# Patient Record
Sex: Female | Born: 1985 | Race: Black or African American | Hispanic: No | Marital: Married | Smoking: Never smoker
Health system: Southern US, Community
[De-identification: ages and names within clinical notes are randomized; demographics above are authoritative.]

## PROBLEM LIST (undated history)

## (undated) DIAGNOSIS — A549 Gonococcal infection, unspecified: Secondary | ICD-10-CM

## (undated) DIAGNOSIS — A749 Chlamydial infection, unspecified: Secondary | ICD-10-CM

## (undated) DIAGNOSIS — B977 Papillomavirus as the cause of diseases classified elsewhere: Secondary | ICD-10-CM

## (undated) DIAGNOSIS — T7840XA Allergy, unspecified, initial encounter: Secondary | ICD-10-CM

## (undated) DIAGNOSIS — G43909 Migraine, unspecified, not intractable, without status migrainosus: Secondary | ICD-10-CM

## (undated) DIAGNOSIS — K219 Gastro-esophageal reflux disease without esophagitis: Secondary | ICD-10-CM

## (undated) DIAGNOSIS — IMO0002 Reserved for concepts with insufficient information to code with codable children: Secondary | ICD-10-CM

## (undated) HISTORY — DX: Gastro-esophageal reflux disease without esophagitis: K21.9

## (undated) HISTORY — DX: Gonococcal infection, unspecified: A54.9

## (undated) HISTORY — DX: Migraine, unspecified, not intractable, without status migrainosus: G43.909

## (undated) HISTORY — DX: Chlamydial infection, unspecified: A74.9

## (undated) HISTORY — PX: COLPOSCOPY: SHX161

## (undated) HISTORY — DX: Papillomavirus as the cause of diseases classified elsewhere: B97.7

## (undated) HISTORY — DX: Reserved for concepts with insufficient information to code with codable children: IMO0002

## (undated) HISTORY — DX: Allergy, unspecified, initial encounter: T78.40XA

---
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
## 2009-06-08 ENCOUNTER — Emergency Department (HOSPITAL_COMMUNITY): Admission: EM | Admit: 2009-06-08 | Discharge: 2009-06-08 | Payer: Self-pay | Admitting: Emergency Medicine

## 2009-08-29 DIAGNOSIS — IMO0002 Reserved for concepts with insufficient information to code with codable children: Secondary | ICD-10-CM

## 2009-08-29 DIAGNOSIS — R87619 Unspecified abnormal cytological findings in specimens from cervix uteri: Secondary | ICD-10-CM

## 2009-08-29 HISTORY — DX: Reserved for concepts with insufficient information to code with codable children: IMO0002

## 2009-08-29 HISTORY — DX: Unspecified abnormal cytological findings in specimens from cervix uteri: R87.619

## 2010-05-20 LAB — URINALYSIS, ROUTINE W REFLEX MICROSCOPIC
Bilirubin Urine: NEGATIVE
Hgb urine dipstick: NEGATIVE
Protein, ur: 30 mg/dL — AB
Specific Gravity, Urine: 1.028 (ref 1.005–1.030)
Urobilinogen, UA: 1 mg/dL (ref 0.0–1.0)

## 2010-05-20 LAB — URINE MICROSCOPIC-ADD ON

## 2011-11-13 ENCOUNTER — Emergency Department (HOSPITAL_COMMUNITY)
Admission: EM | Admit: 2011-11-13 | Discharge: 2011-11-14 | Disposition: A | Discharge status: Home / Self Care | Payer: PRIVATE HEALTH INSURANCE | Attending: Emergency Medicine | Admitting: Emergency Medicine

## 2011-11-13 ENCOUNTER — Encounter (HOSPITAL_COMMUNITY): Payer: Self-pay | Admitting: Emergency Medicine

## 2011-11-13 DIAGNOSIS — M549 Dorsalgia, unspecified: Secondary | ICD-10-CM | POA: Insufficient documentation

## 2011-11-13 NOTE — ED Notes (Signed)
Pt c/o L flank pain onset 4 days ago. Denies urinary s/s, denies injury. Non radiating.

## 2011-11-14 ENCOUNTER — Emergency Department (HOSPITAL_COMMUNITY): Payer: PRIVATE HEALTH INSURANCE

## 2011-11-14 LAB — URINALYSIS, ROUTINE W REFLEX MICROSCOPIC
Glucose, UA: NEGATIVE mg/dL
Protein, ur: NEGATIVE mg/dL

## 2011-11-14 LAB — URINE MICROSCOPIC-ADD ON

## 2011-11-14 MED ORDER — DIAZEPAM 5 MG PO TABS: 5.0000 mg | ORAL_TABLET | Freq: Two times a day (BID) | ORAL | Status: AC

## 2011-11-14 NOTE — ED Provider Notes (Signed)
Medical screening examination/treatment/procedure(s) were performed by non-physician practitioner and as supervising physician I was immediately available for consultation/collaboration.   Lyanne Co, MD 11/14/11 [phone]

## 2011-11-14 NOTE — ED Provider Notes (Signed)
History     CSN: [id number]  Arrival date & time 11/13/11  2328   First MD Initiated Contact with Patient 11/14/11 [phone]      Chief Complaint  Patient presents with  . Flank Pain    (Consider location/radiation/quality/duration/timing/severity/associated sxs/prior treatment) HPI History provided by pt.   Pt presents w/ constant left mid-back pain w/ associated SOB x 3 days.  Pain is pleuritic and aggravated by movement but has improved since she took ibuprofen.  Describes SOB as the sensation that she can't take a deep breath.  Denies fever, cough, chest pain.  Denies abd pain and urinary sx.  Denies trauma.  No LE pain/edema nor RF for PE.    History reviewed. No pertinent past medical history.  History reviewed. No pertinent past surgical history.  No family history on file.  History  Substance Use Topics  . Smoking status: Never Smoker   . Smokeless tobacco: Not on file  . Alcohol Use: Yes     occasional    OB History    Grav Para Term Preterm Abortions TAB SAB Ect Mult Living                  Review of Systems  All other systems reviewed and are negative.    Allergies  Penicillins  Home Medications  No current outpatient prescriptions on file.  BP 112/76  Pulse 84  Temp 98.9 F (37.2 C) (Oral)  Resp 16  Wt 125 lb (56.7 kg)  SpO2 98%  Physical Exam  Nursing note and vitals reviewed. Constitutional: She is oriented to person, place, and time. She appears well-developed and well-nourished. No distress.  HENT:  Head: Normocephalic and atraumatic.  Eyes:       Normal appearance  Neck: Normal range of motion.  Cardiovascular: Normal rate and regular rhythm.   Pulmonary/Chest: Effort normal and breath sounds normal. No respiratory distress.       No pleuritic pain on exam  Abdominal: Soft. Bowel sounds are normal. She exhibits no distension. There is no tenderness.  Musculoskeletal: Normal range of motion.       No LE tenderness/edema.  Entire spine  non-tender.  Mild tenderness left mid-back.  Pain aggravated by twisting torso to the left but not w/ ROM upper extremities.   Neurological: She is alert and oriented to person, place, and time.  Skin: Skin is warm and dry. No rash noted.  Psychiatric: She has a normal mood and affect. Her behavior is normal.    ED Course  Procedures (including critical care time)  Labs Reviewed  URINALYSIS, ROUTINE W REFLEX MICROSCOPIC - Abnormal; Notable for the following:    APPearance CLOUDY (*)     Leukocytes, UA TRACE (*)     All other components within normal limits  URINE MICROSCOPIC-ADD ON - Abnormal; Notable for the following:    Squamous Epithelial / LPF FEW (*)     Bacteria, UA FEW (*)     All other components within normal limits  D-DIMER, QUANTITATIVE  PREGNANCY, URINE   Dg Chest 2 View  11/14/2011  *RADIOLOGY REPORT*  Clinical Data: Chest pain  CHEST - 2 VIEW  Comparison: None.  Findings: Lungs are clear. No pleural effusion or pneumothorax. The cardiomediastinal contours are within normal limits. The visualized bones and soft tissues are without significant appreciable abnormality.  IMPRESSION: No radiographic evidence of acute cardiopulmonary process.   Original Report Authenticated By: Waneta Martins, M.D.      1. Back  pain       MDM  Healthy 25yo F presents w/ non-traumatic, pleuritic left mid-back pain w/ associated SOB (sensation that she can't take a deep breath) x 3 days.  Pain reproducible w/ palpation and movement on exam.  No RF for or signs of PE/DVT on exam and d-dimer neg.  CXR neg.  Results discussed w/ pt.  She declined pain medication in ED because had taken ibuprofen 2 hours prior w/ relief.  Likely has a muscle strain.  Recommended ibuprofen, rest and heat/ice and prescribed 5 valium.  Return precautions discussed.         Otilio Miu, [place] 11/14/11 [phone]

## 2011-12-27 ENCOUNTER — Encounter: Payer: Self-pay | Admitting: Obstetrics and Gynecology

## 2011-12-27 ENCOUNTER — Ambulatory Visit (INDEPENDENT_AMBULATORY_CARE_PROVIDER_SITE_OTHER): Payer: PRIVATE HEALTH INSURANCE | Admitting: Obstetrics and Gynecology

## 2011-12-27 VITALS — BP 98/64 | Ht 62.0 in | Wt 127.0 lb

## 2011-12-27 DIAGNOSIS — N926 Irregular menstruation, unspecified: Secondary | ICD-10-CM

## 2011-12-27 MED ORDER — MEDROXYPROGESTERONE ACETATE 10 MG PO TABS: ORAL_TABLET | ORAL | Status: DC

## 2011-12-27 NOTE — Progress Notes (Signed)
When did bleeding start: 12/07/11 How  Long: 12/24/11 How often changing pad/tampon: used panty liner only  Bleeding Disorders: no Cramping: no Contraception: no Fibroids: no Hormone Therapy: no New Medications: no Menopausal Symptoms: no Vag. Discharge: no Abdominal Pain: no Increased Stress: yes BP 98/64  Ht 5\' 2"  (1.575 m)  Wt 127 lb (57.607 kg)  BMI 23.23 kg/m2  LMP 12/07/2011 Results for orders placed during the hospital encounter of 11/13/11  URINALYSIS, ROUTINE W REFLEX MICROSCOPIC      Component Value Range   Color, Urine YELLOW  YELLOW   APPearance CLOUDY (*) CLEAR   Specific Gravity, Urine 1.026  1.005 - 1.030   pH 7.5  5.0 - 8.0   Glucose, UA NEGATIVE  NEGATIVE mg/dL   Hgb urine dipstick NEGATIVE  NEGATIVE   Bilirubin Urine NEGATIVE  NEGATIVE   Ketones, ur NEGATIVE  NEGATIVE mg/dL   Protein, ur NEGATIVE  NEGATIVE mg/dL   Urobilinogen, UA 1.0  0.0 - 1.0 mg/dL   Nitrite NEGATIVE  NEGATIVE   Leukocytes, UA TRACE (*) NEGATIVE  URINE MICROSCOPIC-ADD ON      Component Value Range   Squamous Epithelial / LPF FEW (*) RARE   WBC, UA 0-2  <3 WBC/hpf   Bacteria, UA FEW (*) RARE   Urine-Other MUCOUS PRESENT    D-DIMER, QUANTITATIVE      Component Value Range   D-Dimer, Quant <0.27  0.00 - 0.48 ug/mL-FEU   BP 98/64  Ht 5\' 2"  (1.575 m)  Wt 127 lb (57.607 kg)  BMI 23.23 kg/m2  LMP 12/07/2011 Pt has had irregular bleeding for 3 weeks.  She uses one panty liner a day.  She just stopped depo provera 3/13.  She does not use or want any more BC.   Physical Examination: General appearance - alert, well appearing, and in no distress Abdomen - soft, nontender, nondistended, no masses or organomegaly Breasts - breasts appear normal, no suspicious masses, no skin or nipple changes or axillary nodes Pelvic - normal external genitalia, vulva, vagina, cervix, uterus and adnexa. Old blood in the vagina.  No active bleeding Extremities - peripheral pulses normal, no pedal edema, no  clubbing or cyanosis metrorrhgia [place] @ [place] Cervical cultures done Rt 2 weeks

## 2011-12-27 NOTE — Addendum Note (Signed)
Addended by: Rolla Plate on: 12/27/2011 11:49 AM   Modules accepted: Orders

## 2011-12-27 NOTE — Patient Instructions (Signed)
Metrorrhagia   Metrorrhagia is uterine bleeding at irregular intervals, especially between menstrual periods.   CAUSES    Dysfunctional uterine bleeding.   Uterine lining growing outside the uterus (endometriosis).   Embryo adhering to uterine wall (implantation).   Pregnancy growing in the fallopian tubes (ectopic pregnancy).   Miscarriage.   Menopause.   Cancer of the reproduction organs.   Certain drugs such as hormonal contraceptives.   Inherited bleeding disorders.   Trauma.   Uterine fibroids.   Sexually transmitted diseases (STDs).   Polycystic ovarian disease.  DIAGNOSIS   A history will be taken.   A physical exam will be performed.   Other tests may include:   Blood tests.   A pregnancy test.   An ultrasound of the abdomen and pelvis.   A biopsy of the uterine lining.   AMRI or CT scan of the abdomen and pelvis.  TREATMENT  Treatment will depend on the cause.  HOME CARE INSTRUCTIONS    Take all medicines as directed by your caregiver. Do not change or switch medicines without talking to your caregiver.   Take all iron supplements exactly as directed by your caregiver. Iron supplements help to replace the iron your body loses from irregular bleeding.If you become constipated, increase the amount of fiber, fruits, and vegetables in your diet.   Do not take aspirin or medicines that contain aspirin for 1 week before your menstrual period or during your menstrual period. Aspirin may increase the bleeding.   Rest as much as possible if you change your sanitary pad or tampon more than once every 2 hours.   Eat well-balanced meals including foods high in iron, such as green leafy vegetables, red meat, liver, eggs, and whole-grain breads and cereals.   Do not try to lose weight until the abnormal bleeding is controlled and your blood iron level is back to normal.  SEEK MEDICAL CARE IF:    You have nausea and vomiting, or you cannot keep foods down.   You feel dizzy or have diarrhea  while taking medicine.   You have any problems that may be related to the medicine you are taking.  SEEK IMMEDIATE MEDICAL CARE IF:    You have a fever.   You develop chills.   You become lightheaded or faint.   You need to change your sanitary pad or tampon more than once an hour.   Your bleeding becomesheavy.   You begin to pass clots or tissue.  MAKE SURE YOU:    Understand these instructions.   Will watch your condition.   Will get help right away if you are not doing well or get worse.  Document Released: 02/15/2005 Document Revised: 05/10/2011 Document Reviewed: 09/14/2010  ExitCare Patient Information 2013 [employer].

## 2012-01-12 ENCOUNTER — Ambulatory Visit (INDEPENDENT_AMBULATORY_CARE_PROVIDER_SITE_OTHER): Payer: PRIVATE HEALTH INSURANCE | Admitting: Obstetrics and Gynecology

## 2012-01-12 ENCOUNTER — Encounter: Payer: Self-pay | Admitting: Obstetrics and Gynecology

## 2012-01-12 ENCOUNTER — Ambulatory Visit (INDEPENDENT_AMBULATORY_CARE_PROVIDER_SITE_OTHER): Payer: PRIVATE HEALTH INSURANCE

## 2012-01-12 ENCOUNTER — Other Ambulatory Visit: Payer: Self-pay | Admitting: Obstetrics and Gynecology

## 2012-01-12 VITALS — BP 100/58 | Wt 124.0 lb

## 2012-01-12 DIAGNOSIS — N926 Irregular menstruation, unspecified: Secondary | ICD-10-CM

## 2012-01-12 DIAGNOSIS — Z2089 Contact with and (suspected) exposure to other communicable diseases: Secondary | ICD-10-CM

## 2012-01-12 DIAGNOSIS — Z202 Contact with and (suspected) exposure to infections with a predominantly sexual mode of transmission: Secondary | ICD-10-CM

## 2012-01-12 LAB — RPR

## 2012-01-12 LAB — HIV ANTIBODY (ROUTINE TESTING W REFLEX): HIV: NONREACTIVE

## 2012-01-12 LAB — TSH: TSH: 1.763 u[IU]/mL (ref 0.350–4.500)

## 2012-01-12 NOTE — Patient Instructions (Signed)
Contraception Choices  Birth control (contraception) can stop pregnancy from happening. Different types of birth control work in different ways. Some can:  · Make the mucus in the cervix thick. This makes it hard for sperm to get into the uterus.  · Thin the lining of the uterus. This makes it hard for an egg to attach to the wall of the uterus.  · Stop the ovaries from releasing an egg.  · Block the sperm from reaching the egg.  Certain types of surgery can stop pregnancy from happening. For women, the sugery closes the fallopian tubes (tubal ligation). For men, the surgery stops sperm from releasing during sex (vasectomy).  HORMONAL BIRTH CONTROL  Hormonal birth control stops pregnancy by putting hormones into your body. Types of birth control include:  · A small tube put under the skin of the upper arm (implant). The tube can stay in place for 3 years.  · Shots given every 3 months.  · Pills taken every day or once after sex (intercourse).  · Patches that are changed once a week.  · A ring put into the vagina (vaginal ring). The ring is left in place for 3 weeks and removed for 1 week. Then, a new ring is put in the vagina.  BARRIER BIRTH CONTROL   Barrier birth control blocks sperm from reaching the egg. Types of birth control include:   · A thin covering worn on the penis (female condom) during sex.  · A soft, loose covering put into the vagina (female condom) before sex.  · A rubber bowl that sits over the cervix (diaphragm). The bowl must be made for you. The bowl is put into the vagina before sex. The bowl is left in place for 6 to 8 hours after sex.  · A small, soft cup that fits over the cervix (cervical cap). The cup must be made for you. The cup can be left in place for 48 hours after sex.  · A sponge that is put into the vagina before sex.  · A chemical that kills or blocks sperm from getting into the cervix and uterus (spermicide). The chemical may be a cream, jelly, foam, or pill.  INTRAUTERINE (IUD)  BIRTH CONTROL   IUD birth control is a small, T-shaped piece of plastic. The plastic is put inside the uterus. There are 2 types of IUD:  · Copper IUD. The IUD is covered in copper wire. The copper makes a fluid that kills sperm. It can stay in place for 10 years.  · Hormone IUD. The hormone stops pregnancy from happening. It can stay in place for 5 years.  NATURAL FAMILY PLANNING BIRTH CONTROL   Natural family planning means not having sex or using barrier birth control when the woman is fertile. A woman can:  · Use a calendar to keep track of when she is fertile.  · Use a thermometer to measure her body temperature.  Protect yourself against sexual diseases no matter what type of birth control you use. Talk to your doctor about which type of birth control is best for you.  Document Released: 12/13/2008 Document Revised: 05/10/2011 Document Reviewed: 06/24/2010  ExitCare® Patient Information ©2013 [employer].

## 2012-01-12 NOTE — Progress Notes (Signed)
F/u u/s BP 100/58  Wt 124 lb (56.246 kg)  LMP 12/07/2011 Pt stopped bleeding with the provera [place] is normal Pt desires to have STD testing.  Will check tsh and cbc as well Pt desires to start ocps.  No contrindication Lo/LO estrin given with verbal and written instruction.

## 2012-01-13 LAB — HSV 1 ANTIBODY, IGG: HSV 1 Glycoprotein G Ab, IgG: 0.8 IV

## 2012-01-13 LAB — HSV 2 ANTIBODY, IGG: HSV 2 Glycoprotein G Ab, IgG: 0.16 IV

## 2012-02-01 ENCOUNTER — Other Ambulatory Visit: Payer: Self-pay | Admitting: Obstetrics and Gynecology

## 2012-02-01 ENCOUNTER — Telehealth: Payer: Self-pay | Admitting: Obstetrics and Gynecology

## 2012-02-01 NOTE — Telephone Encounter (Signed)
LM for pt with the names of a couple of female PCP's. I gave her Joselyn Arrow, M.D. And Mila Palmer, M.D. Melody Comas A

## 2012-02-08 ENCOUNTER — Ambulatory Visit (INDEPENDENT_AMBULATORY_CARE_PROVIDER_SITE_OTHER): Payer: PRIVATE HEALTH INSURANCE | Admitting: Family Medicine

## 2012-02-08 VITALS — BP 112/72 | HR 85 | Temp 98.4°F | Resp 17 | Ht 62.0 in | Wt 129.0 lb

## 2012-02-08 DIAGNOSIS — N898 Other specified noninflammatory disorders of vagina: Secondary | ICD-10-CM | POA: Insufficient documentation

## 2012-02-08 LAB — POCT WET PREP WITH KOH: KOH Prep POC: POSITIVE

## 2012-02-08 MED ORDER — FLUCONAZOLE 150 MG PO TABS: 150.0000 mg | ORAL_TABLET | Freq: Once | ORAL | Status: DC

## 2012-02-08 MED ORDER — METRONIDAZOLE 500 MG PO TABS: 500.0000 mg | ORAL_TABLET | Freq: Two times a day (BID) | ORAL | Status: DC

## 2012-02-08 NOTE — Patient Instructions (Addendum)
Thank you for coming in today. It looks like you have both yeast and BV.  Take the metronidazole twice a day for a week.  Also take the fluconazole once.  Come back if you do not improve. I will call if the other test results are positive.   Bacterial Vaginosis Bacterial vaginosis (BV) is a vaginal infection where the normal balance of bacteria in the vagina is disrupted. The normal balance is then replaced by an overgrowth of certain bacteria. There are several different kinds of bacteria that can cause BV. BV is the most common vaginal infection in women of childbearing age. CAUSES    The cause of BV is not fully understood. BV develops when there is an increase or imbalance of harmful bacteria.   Some activities or behaviors can upset the normal balance of bacteria in the vagina and put women at increased risk including:   Having a new sex partner or multiple sex partners.   Douching.   Using an intrauterine device (IUD) for contraception.   It is not clear what role sexual activity plays in the development of BV. However, women that have never had sexual intercourse are rarely infected with BV.  Women do not get BV from toilet seats, bedding, swimming pools or from touching objects around them.   SYMPTOMS    Grey vaginal discharge.   A fish-like odor with discharge, especially after sexual intercourse.   Itching or burning of the vagina and vulva.   Burning or pain with urination.   Some women have no signs or symptoms at all.  DIAGNOSIS   Your caregiver must examine the vagina for signs of BV. Your caregiver will perform lab tests and look at the sample of vaginal fluid through a microscope. They will look for bacteria and abnormal cells (clue cells), a pH test higher than 4.5, and a positive amine test all associated with BV.   RISKS AND COMPLICATIONS    Pelvic inflammatory disease (PID).   Infections following gynecology surgery.   Developing HIV.   Developing  herpes virus.  TREATMENT   Sometimes BV will clear up without treatment. However, all women with symptoms of BV should be treated to avoid complications, especially if gynecology surgery is planned. Female partners generally do not need to be treated. However, BV may spread between female sex partners so treatment is helpful in preventing a recurrence of BV.    BV may be treated with antibiotics. The antibiotics come in either pill or vaginal cream forms. Either can be used with nonpregnant or pregnant women, but the recommended dosages differ. These antibiotics are not harmful to the baby.   BV can recur after treatment. If this happens, a second round of antibiotics will often be prescribed.   Treatment is important for pregnant women. If not treated, BV can cause a premature delivery, especially for a pregnant woman who had a premature birth in the past. All pregnant women who have symptoms of BV should be checked and treated.   For chronic reoccurrence of BV, treatment with a type of prescribed gel vaginally twice a week is helpful.  HOME CARE INSTRUCTIONS    Finish all medication as directed by your caregiver.   Do not have sex until treatment is completed.   Tell your sexual partner that you have a vaginal infection. They should see their caregiver and be treated if they have problems, such as a mild rash or itching.   Practice safe sex. Use condoms. Only have  1 sex partner.  PREVENTION   Basic prevention steps can help reduce the risk of upsetting the natural balance of bacteria in the vagina and developing BV:  Do not have sexual intercourse (be abstinent).   Do not douche.   Use all of the medicine prescribed for treatment of BV, even if the signs and symptoms go away.   Tell your sex partner if you have BV. That way, they can be treated, if needed, to prevent reoccurrence.  SEEK MEDICAL CARE IF:    Your symptoms are not improving after 3 days of treatment.   You have  increased discharge, pain, or fever.  MAKE SURE YOU:    Understand these instructions.   Will watch your condition.   Will get help right away if you are not doing well or get worse.  FOR MORE INFORMATION   Division of STD Prevention (DSTDP), Centers for Disease Control and Prevention: [employer]s.[employer] American Social Health Association (ASHA): www.ashastd.org   Document Released: 02/15/2005 Document Revised: 05/10/2011 Document Reviewed: 08/08/2008 [facility] Patient Information 2013 [place], [place].

## 2012-02-08 NOTE — Progress Notes (Signed)
Krista Kidd is a 26 y.o. female who presents to [facility] today for vaginal discharge present for the last 48 hours. The discharge is itchy and associated with a fishy smell.  She notes mild abdominal pain but feels well otherwise. She denies any fevers chills constipation nausea vomiting or diarrhea.  She denies any dysuria.  She notes this is consistent with BV and high school.  She denies any recent new sexual contacts.  She is taking oral contraceptives. She has not tried anything yet.  PMH: Reviewed hx Chlamydia and gonorrhea 2011 History  Substance Use Topics  . Smoking status: Never Smoker   . Smokeless tobacco: Not on file  . Alcohol Use: Yes     Comment: occasional   ROS as above  Medications reviewed. Current Outpatient Prescriptions  Medication Sig Dispense Refill  . ibuprofen (ADVIL,MOTRIN) 200 MG tablet Take 200 mg by mouth every 6 (six) hours as needed.      . Multiple Vitamins-Minerals (ZINC PO) Take by mouth.      . fluconazole (DIFLUCAN) 150 MG tablet Take 1 tablet (150 mg total) by mouth once.  1 tablet  1  . medroxyPROGESTERone (PROVERA) 10 MG tablet One by mouth daily for ten days  30 tablet  0  . metroNIDAZOLE (FLAGYL) 500 MG tablet Take 1 tablet (500 mg total) by mouth 2 (two) times daily.  14 tablet  0  . VITAMIN A PO Take by mouth.        Exam:  BP 112/72  Pulse 85  Temp 98.4 F (36.9 C) (Oral)  Resp 17  Ht 5\' 2"  (1.575 m)  Wt 129 lb (58.514 kg)  BMI 23.59 kg/m2  SpO2 100% Gen: Well NAD GENITALS: Normal external genitalia. Vaginal canal with copious yellowish discharge.   Cervix normal and present. Bimanual exam is normal with no cervical motion tenderness or adnexal fullness or tenderness with normal uterus size and position.     Results for orders placed in visit on 02/08/12 (from the past 72 hour(s))  POCT WET PREP WITH KOH     Status: Normal   Collection Time   02/08/12  7:33 PM      Component Value Range Comment   Trichomonas, UA Negative      Clue Cells Wet Prep HPF POC 3-6      Epithelial Wet Prep HPF POC 2-4      Yeast Wet Prep HPF POC positive      Bacteria Wet Prep HPF POC 3+      RBC Wet Prep HPF POC 1-5      WBC Wet Prep HPF POC 5-11      KOH Prep POC Positive       Assessment and Plan: 26 y.o. female with BV and yeast.  Test results for gonorrhea and Chlamydia are pending. Plan: Empiric treatment with metronidazole and fluconazole. Come back as needed if not improved. Discussed warning signs or symptoms. Please see discharge instructions. Patient expresses understanding. Will call if test results positive.

## 2012-02-10 ENCOUNTER — Ambulatory Visit (INDEPENDENT_AMBULATORY_CARE_PROVIDER_SITE_OTHER): Payer: PRIVATE HEALTH INSURANCE | Admitting: Physician Assistant

## 2012-02-10 ENCOUNTER — Telehealth: Payer: Self-pay

## 2012-02-10 VITALS — BP 110/72 | HR 76 | Temp 98.4°F | Resp 16 | Ht 61.75 in | Wt 126.0 lb

## 2012-02-10 DIAGNOSIS — N898 Other specified noninflammatory disorders of vagina: Secondary | ICD-10-CM

## 2012-02-10 DIAGNOSIS — B373 Candidiasis of vulva and vagina: Secondary | ICD-10-CM

## 2012-02-10 DIAGNOSIS — B3731 Acute candidiasis of vulva and vagina: Secondary | ICD-10-CM

## 2012-02-10 LAB — GC/CHLAMYDIA PROBE AMP
CT Probe RNA: NEGATIVE
GC Probe RNA: NEGATIVE

## 2012-02-10 MED ORDER — CLOTRIMAZOLE 1 % VA CREA: 1.0000 | TOPICAL_CREAM | Freq: Two times a day (BID) | VAGINAL | Status: DC

## 2012-02-10 MED ORDER — FLUCONAZOLE 150 MG PO TABS: 150.0000 mg | ORAL_TABLET | Freq: Once | ORAL | Status: DC

## 2012-02-10 NOTE — Progress Notes (Signed)
  Subjective:    Patient ID: Krista Kidd, female    DOB: [birth year], 26 y.o.   MRN: [id number]  HPI 26 year old female presents for recheck of vaginal discharge and a "knot" on the posterior portion of her vaginal. States it has been there since her last OV 2 days ago, but she did not have it evaluated at that time.  She is being treated for vaginal yeast and bacterial vaginosis.  States discharge has improved but has not resolved 100%.  Admits to vaginal pruritis and a palpable knot associated with discomfort. Denies drainage, warmth, erythema, or induration. No history of similar lesions. Denies any rash, fever, chills, nausea, or vomiting.     Review of Systems  Constitutional: Negative for fever and chills.  Gastrointestinal: Negative for nausea, vomiting and abdominal pain.  Genitourinary: Positive for vaginal pain (pruritis and discomfort). Negative for dysuria, vaginal discharge and pelvic pain.  Skin: Negative for rash and wound.  Neurological: Negative for headaches.  All other systems reviewed and are negative.       Objective:   Physical Exam  Constitutional: She is oriented to person, place, and time. She appears well-developed and well-nourished.  HENT:  Head: Normocephalic and atraumatic.  Right Ear: External ear normal.  Left Ear: External ear normal.  Eyes: Conjunctivae normal are normal.  Neck: Normal range of motion.  Cardiovascular: Normal rate.   Pulmonary/Chest: Effort normal.  Genitourinary: Vagina normal.    Pelvic exam was performed with patient supine. There is no rash, tenderness or lesion on the right labia. There is no rash, tenderness or lesion on the left labia.  Neurological: She is alert and oriented to person, place, and time.  Psychiatric: She has a normal mood and affect. Her behavior is normal. Judgment and thought content normal.          Assessment & Plan:   1. Vaginal discharge  fluconazole (DIFLUCAN) 150 MG tablet  2. Yeast  vaginitis  clotrimazole (GYNE-LOTRIMIN) 1 % vaginal cream   Likely either inflamed Bartholin's gland or lymph node. No evidence of infection or induration.   Irritation likely due to yeast infection - will refill diflucan and use topical clotrimazole bid Recommend warm bath for comfort If no improvement after completion of flagyl and diflucan, call us and we can refer to gyn for further evaluation and treatment.

## 2012-02-10 NOTE — Telephone Encounter (Signed)
PT NEEDS TO SPEAK WITH SOMEONE ABOUT HER RECENT OV. EVIDENTLY SHE IS NOT FEELING ANY BETTER? COULD NOT GET MUCH INFORMATION FROM HER. PLEASE CALL BEFORE 5 TODAY [phone] AFTER 5 TODAY [phone]

## 2012-02-11 NOTE — Telephone Encounter (Signed)
She came in to be seen yesterday, was treated by [facility].

## 2012-03-09 ENCOUNTER — Telehealth: Payer: Self-pay

## 2012-03-09 MED ORDER — NORETHIN-ETH ESTRAD-FE BIPHAS 1 MG-10 MCG / 10 MCG PO TABS: 1.0000 | ORAL_TABLET | Freq: Every day | ORAL | Status: AC

## 2012-03-09 NOTE — Telephone Encounter (Signed)
Spoke with pt rgd call pt want rx for lo Loestrin samples given at last visit rx sent to pharm pt voice understanding

## 2012-03-20 ENCOUNTER — Ambulatory Visit (INDEPENDENT_AMBULATORY_CARE_PROVIDER_SITE_OTHER): Payer: PRIVATE HEALTH INSURANCE | Admitting: Emergency Medicine

## 2012-03-20 ENCOUNTER — Telehealth: Payer: Self-pay

## 2012-03-20 ENCOUNTER — Telehealth: Payer: Self-pay | Admitting: Obstetrics and Gynecology

## 2012-03-20 VITALS — BP 102/60 | HR 64 | Temp 98.1°F | Resp 16 | Ht 61.5 in | Wt 126.0 lb

## 2012-03-20 DIAGNOSIS — N898 Other specified noninflammatory disorders of vagina: Secondary | ICD-10-CM

## 2012-03-20 DIAGNOSIS — B9689 Other specified bacterial agents as the cause of diseases classified elsewhere: Secondary | ICD-10-CM

## 2012-03-20 DIAGNOSIS — N760 Acute vaginitis: Secondary | ICD-10-CM

## 2012-03-20 LAB — POCT URINALYSIS DIPSTICK
Bilirubin, UA: NEGATIVE
Glucose, UA: NEGATIVE
Nitrite, UA: NEGATIVE
Urobilinogen, UA: 0.2

## 2012-03-20 LAB — POCT WET PREP WITH KOH
KOH Prep POC: NEGATIVE
Trichomonas, UA: NEGATIVE

## 2012-03-20 MED ORDER — METRONIDAZOLE 500 MG PO TABS: 500.0000 mg | ORAL_TABLET | Freq: Two times a day (BID) | ORAL | Status: DC

## 2012-03-20 MED ORDER — FLUCONAZOLE 150 MG PO TABS: 150.0000 mg | ORAL_TABLET | Freq: Once | ORAL | Status: DC

## 2012-03-20 NOTE — Telephone Encounter (Signed)
Spoke with pt rgd msg pt wants refill on flagyl advise pt need ov for eval offered pt an appt pt declined appt

## 2012-03-20 NOTE — Progress Notes (Signed)
[facility] [address], [place] [address] [phone]  Date:  03/20/2012   Name:  Krista Kidd   DOB:  07-18-1985   MRN:  [id number]  PCP:  Pcp Not In System    Chief Complaint: Vaginal Discharge   History of Present Illness:  Krista Kidd is a 27 y.o. very pleasant female patient who presents with the following:  Has vaginal discharge started Saturday.  Previously multiple visits for BV and candiasis.  Not taking antibiotics or steroids. On OCP.  Possible exposure to STD.  Denies fever or chills, dyspareunia, nausea or vomiting.  LMP 10 days ago.  Patient Active Problem List  Diagnosis  . Vaginal discharge    Past Medical History  Diagnosis Date  . Abnormal Pap smear 08/2009  . Chlamydia   . Gonorrhea   . HPV in female   . Headache, migraine   . GERD (gastroesophageal reflux disease)     Past Surgical History  Procedure Date  . Colposcopy     History  Substance Use Topics  . Smoking status: Never Smoker   . Smokeless tobacco: Not on file  . Alcohol Use: Yes     Comment: occasional    Family History  Problem Relation Age of Onset  . Hypertension Paternal Grandmother   . Diabetes Paternal Grandmother   . Hypertension Maternal Grandmother   . Diabetes Maternal Grandmother   . Hypertension Maternal Grandfather   . Diabetes Maternal Grandfather   . Hypertension Mother     Allergies  Allergen Reactions  . Penicillins Hives    Medication list has been reviewed and updated.  Current Outpatient Prescriptions on File Prior to Visit  Medication Sig Dispense Refill  . ibuprofen (ADVIL,MOTRIN) 200 MG tablet Take 200 mg by mouth every 6 (six) hours as needed.      . Norethindrone-Ethinyl Estradiol-Fe Biphas (LO LOESTRIN FE) 1 MG-10 MCG / 10 MCG tablet Take 1 tablet by mouth daily.  1 Package  3  . clotrimazole (GYNE-LOTRIMIN) 1 % vaginal cream Place 1 Applicatorful vaginally 2 (two) times daily.  45 g  0  . fluconazole (DIFLUCAN)  150 MG tablet Take 1 tablet (150 mg total) by mouth once. May repeat in 7 days if symptoms persist.  2 tablet  0  . medroxyPROGESTERone (PROVERA) 10 MG tablet One by mouth daily for ten days  30 tablet  0  . metroNIDAZOLE (FLAGYL) 500 MG tablet Take 1 tablet (500 mg total) by mouth 2 (two) times daily.  14 tablet  0  . Multiple Vitamins-Minerals (ZINC PO) Take by mouth.      Marland Kitchen VITAMIN A PO Take by mouth.        Review of Systems:  As per HPI, otherwise negative.    Physical Examination: Filed Vitals:   03/20/12 1738  BP: 102/60  Pulse: 64  Temp: 98.1 F (36.7 C)  Resp: 16   Filed Vitals:   03/20/12 1738  Height: 5' 1.5" (1.562 m)  Weight: 126 lb (57.153 kg)   Body mass index is 23.42 kg/(m^2). Ideal Body Weight: Weight in (lb) to have BMI = 25: 134.2   GEN: WDWN, NAD, Non-toxic, A & O x 3 HEENT: Atraumatic, Normocephalic. Neck supple. No masses, No LAD. Ears and Nose: No external deformity. CV: RRR, No M/G/R. No JVD. No thrill. No extra heart sounds. PULM: CTA B, no wheezes, crackles, rhonchi. No retractions. No resp. distress. No accessory muscle use. ABD: S, NT,  ND, +BS. No rebound. No HSM. EXTR: No c/c/e NEURO Normal gait.  PSYCH: Normally interactive. Conversant. Not depressed or anxious appearing.  Calm demeanor.  Pelvic - normal external genitalia, vulva, vagina, cervix, uterus and adnexa.  Thick white cheesy discharge   Assessment and Plan: BV flagyl  Carmelina Dane, MD   Results for orders placed in visit on 03/20/12  POCT WET PREP WITH KOH      Component Value Range   Trichomonas, UA Negative     Clue Cells Wet Prep HPF POC 80%     Epithelial Wet Prep HPF POC 1-3     Yeast Wet Prep HPF POC neg     Bacteria Wet Prep HPF POC 2+     RBC Wet Prep HPF POC 0-1     WBC Wet Prep HPF POC neg     KOH Prep POC Negative

## 2012-03-20 NOTE — Patient Instructions (Addendum)
Bacterial Vaginosis Bacterial vaginosis (BV) is a vaginal infection where the normal balance of bacteria in the vagina is disrupted. The normal balance is then replaced by an overgrowth of certain bacteria. There are several different kinds of bacteria that can cause BV. BV is the most common vaginal infection in women of childbearing age. CAUSES   The cause of BV is not fully understood. BV develops when there is an increase or imbalance of harmful bacteria.  Some activities or behaviors can upset the normal balance of bacteria in the vagina and put women at increased risk including:  Having a new sex partner or multiple sex partners.  Douching.  Using an intrauterine device (IUD) for contraception.  It is not clear what role sexual activity plays in the development of BV. However, women that have never had sexual intercourse are rarely infected with BV. Women do not get BV from toilet seats, bedding, swimming pools or from touching objects around them.  SYMPTOMS   Grey vaginal discharge.  A fish-like odor with discharge, especially after sexual intercourse.  Itching or burning of the vagina and vulva.  Burning or pain with urination.  Some women have no signs or symptoms at all. DIAGNOSIS  Your caregiver must examine the vagina for signs of BV. Your caregiver will perform lab tests and look at the sample of vaginal fluid through a microscope. They will look for bacteria and abnormal cells (clue cells), a pH test higher than 4.5, and a positive amine test all associated with BV.  RISKS AND COMPLICATIONS   Pelvic inflammatory disease (PID).  Infections following gynecology surgery.  Developing HIV.  Developing herpes virus. TREATMENT  Sometimes BV will clear up without treatment. However, all women with symptoms of BV should be treated to avoid complications, especially if gynecology surgery is planned. Female partners generally do not need to be treated. However, BV may spread  between female sex partners so treatment is helpful in preventing a recurrence of BV.   BV may be treated with antibiotics. The antibiotics come in either pill or vaginal cream forms. Either can be used with nonpregnant or pregnant women, but the recommended dosages differ. These antibiotics are not harmful to the baby.  BV can recur after treatment. If this happens, a second round of antibiotics will often be prescribed.  Treatment is important for pregnant women. If not treated, BV can cause a premature delivery, especially for a pregnant woman who had a premature birth in the past. All pregnant women who have symptoms of BV should be checked and treated.  For chronic reoccurrence of BV, treatment with a type of prescribed gel vaginally twice a week is helpful. HOME CARE INSTRUCTIONS   Finish all medication as directed by your caregiver.  Do not have sex until treatment is completed.  Tell your sexual partner that you have a vaginal infection. They should see their caregiver and be treated if they have problems, such as a mild rash or itching.  Practice safe sex. Use condoms. Only have 1 sex partner. PREVENTION  Basic prevention steps can help reduce the risk of upsetting the natural balance of bacteria in the vagina and developing BV:  Do not have sexual intercourse (be abstinent).  Do not douche.  Use all of the medicine prescribed for treatment of BV, even if the signs and symptoms go away.  Tell your sex partner if you have BV. That way, they can be treated, if needed, to prevent reoccurrence. SEEK MEDICAL CARE IF:     Your symptoms are not improving after 3 days of treatment.  You have increased discharge, pain, or fever. MAKE SURE YOU:   Understand these instructions.  Will watch your condition.  Will get help right away if you are not doing well or get worse. FOR MORE INFORMATION  Division of STD Prevention (DSTDP), Centers for Disease Control and Prevention:  www.cdc.gov/std American Social Health Association (ASHA): www.ashastd.org  Document Released: 02/15/2005 Document Revised: 05/10/2011 Document Reviewed: 08/08/2008 ExitCare Patient Information 2013 [employer].  

## 2012-03-21 NOTE — Telephone Encounter (Signed)
Lm on vm tcb rgd msg 

## 2012-03-22 LAB — GC/CHLAMYDIA PROBE AMP: GC Probe RNA: NEGATIVE

## 2012-07-17 ENCOUNTER — Ambulatory Visit (INDEPENDENT_AMBULATORY_CARE_PROVIDER_SITE_OTHER): Payer: PRIVATE HEALTH INSURANCE | Admitting: Family Medicine

## 2012-07-17 VITALS — BP 112/64 | HR 64 | Temp 98.3°F | Resp 14 | Ht 62.0 in | Wt 124.0 lb

## 2012-07-17 DIAGNOSIS — Z113 Encounter for screening for infections with a predominantly sexual mode of transmission: Secondary | ICD-10-CM

## 2012-07-17 DIAGNOSIS — N898 Other specified noninflammatory disorders of vagina: Secondary | ICD-10-CM

## 2012-07-17 LAB — POCT UA - MICROSCOPIC ONLY
Casts, Ur, LPF, POC: NEGATIVE
Crystals, Ur, HPF, POC: NEGATIVE
Yeast, UA: NEGATIVE

## 2012-07-17 LAB — POCT WET PREP WITH KOH
Bacteria Wet Prep HPF POC: 2
Clue Cells Wet Prep HPF POC: NEGATIVE
KOH Prep POC: NEGATIVE
RBC Wet Prep HPF POC: NEGATIVE
Trichomonas, UA: NEGATIVE
Yeast Wet Prep HPF POC: NEGATIVE

## 2012-07-17 NOTE — Progress Notes (Signed)
[facility]:  Office Visit  Chief Complaint:  Chief Complaint  Patient presents with  . Exposure to STD    vaginal discharge    HPI: Krista Kidd is a 27 y.o. female who complains of: Wants to have STD screening, everything but HIV Last pap was 1 year ago, was normal 1 year ago (in [place]) + Vagina DC, white, non odorous, deneis f/c/n/v/abd pain Has a history of chlamydia 2009, treated Has a history of gonorrhea 2009, treated Does not use condoms with boyfriend, Not sure if she has been exposed to STD . She just wants to be safe.  She is OCP and does not get her cycles anymore  Past Medical History  Diagnosis Date  . Abnormal Pap smear 08/2009  . Chlamydia   . Gonorrhea   . HPV in female   . Headache, migraine   . GERD (gastroesophageal reflux disease)    Past Surgical History  Procedure Laterality Date  . Colposcopy     History   Social History  . Marital Status: Married    Spouse Name: N/A    Number of Children: N/A  . Years of Education: N/A   Social History Main Topics  . Smoking status: Never Smoker   . Smokeless tobacco: None  . Alcohol Use: Yes     Comment: occasional  . Drug Use: No  . Sexually Active: Yes    Birth Control/ Protection: None, Condom   Other Topics Concern  . None   Social History Narrative  . None   Family History  Problem Relation Age of Onset  . Hypertension Paternal Grandmother   . Diabetes Paternal Grandmother   . Hypertension Maternal Grandmother   . Diabetes Maternal Grandmother   . Hypertension Maternal Grandfather   . Diabetes Maternal Grandfather   . Hypertension Mother    Allergies  Allergen Reactions  . Penicillins Hives   Prior to Admission medications   Medication Sig Start Date End Date Taking? Authorizing Provider  Norethindrone-Ethinyl Estradiol-Fe Biphas (LO LOESTRIN FE) 1 MG-10 MCG / 10 MCG tablet Take 1 tablet by mouth daily. 03/09/12  Yes Naima A Dillard, MD  clotrimazole  (GYNE-LOTRIMIN) 1 % vaginal cream Place 1 Applicatorful vaginally 2 (two) times daily. 02/10/12   Heather Jaquita Rector, PA-C  fluconazole (DIFLUCAN) 150 MG tablet Take 1 tablet (150 mg total) by mouth once. May repeat in 7 days if symptoms persist. 03/20/12   Phillips Odor, MD  ibuprofen (ADVIL,MOTRIN) 200 MG tablet Take 200 mg by mouth every 6 (six) hours as needed.    Historical Provider, MD  medroxyPROGESTERone (PROVERA) 10 MG tablet One by mouth daily for ten days 12/27/11   Naima A Dillard, MD  metroNIDAZOLE (FLAGYL) 500 MG tablet Take 1 tablet (500 mg total) by mouth 2 (two) times daily. 03/20/12   Phillips Odor, MD  Multiple Vitamins-Minerals (ZINC PO) Take by mouth.    Historical Provider, MD  VITAMIN A PO Take by mouth.    Historical Provider, MD     ROS: The patient denies fevers, chills, night sweats, unintentional weight loss, chest pain, palpitations, wheezing, dyspnea on exertion, nausea, vomiting, abdominal pain, dysuria, hematuria, melena, numbness, weakness, or tingling.   All other systems have been reviewed and were otherwise negative with the exception of those mentioned in the HPI and as above.    PHYSICAL EXAM: Filed Vitals:   07/17/12 1838  BP: 112/64  Pulse: 64  Temp: 98.3 F (36.8 C)  Resp: 14   Filed Vitals:   07/17/12 1838  Height: 5\' 2"  (1.575 m)  Weight: 124 lb (56.246 kg)   Body mass index is 22.67 kg/(m^2).  General: Alert, no acute distress HEENT:  Normocephalic, atraumatic, oropharynx patent.  Cardiovascular:  Regular rate and rhythm, no rubs murmurs or gallops.  No Carotid bruits, radial pulse intact. No pedal edema.  Respiratory: Clear to auscultation bilaterally.  No wheezes, rales, or rhonchi.  No cyanosis, no use of accessory musculature GI: No organomegaly, abdomen is soft and non-tender, positive bowel sounds.  No masses. Skin: No rashes. Neurologic: Facial musculature symmetric. Psychiatric: Patient is appropriate throughout our  interaction. Lymphatic: No cervical lymphadenopathy Musculoskeletal: Gait intact. Vaginal Dc-white Nonodorous No masses, no lesions, no  CMT Nontender adnexa  LABS: Results for orders placed in visit on 07/17/12  POCT UA - MICROSCOPIC ONLY      Result Value Range   WBC, Ur, HPF, POC 1-4     RBC, urine, microscopic 0-3     Bacteria, U Microscopic trace     Mucus, UA trace     Epithelial cells, urine per micros 0-4     Crystals, Ur, HPF, POC neg     Casts, Ur, LPF, POC neg     Yeast, UA neg    POCT WET PREP WITH KOH      Result Value Range   Trichomonas, UA Negative     Clue Cells Wet Prep HPF POC neg     Epithelial Wet Prep HPF POC 1-3     Yeast Wet Prep HPF POC neg     Bacteria Wet Prep HPF POC 2     RBC Wet Prep HPF POC neg     WBC Wet Prep HPF POC 0-3     KOH Prep POC Negative       EKG/XRAY:   Primary read interpreted by Dr. Conley Rolls at [facility]   ASSESSMENT/PLAN: Encounter Diagnoses  Name Primary?  . Vaginal discharge Yes  . Screening for STD (sexually transmitted disease)    Declines to get ppx for Chlamydia/Gonorrhea Decline blood work any other STDs since she does nto want to be stuck with a needle Would like to wait for genital G/C probe results Labs pending F/u prn    [name], DO 07/17/2012 7:36 PM

## 2012-07-19 LAB — GC/CHLAMYDIA PROBE AMP
CT Probe RNA: POSITIVE — AB
GC Probe RNA: NEGATIVE

## 2012-07-22 ENCOUNTER — Ambulatory Visit (INDEPENDENT_AMBULATORY_CARE_PROVIDER_SITE_OTHER): Payer: PRIVATE HEALTH INSURANCE | Admitting: Family Medicine

## 2012-07-22 ENCOUNTER — Telehealth: Payer: Self-pay

## 2012-07-22 VITALS — BP 115/69 | HR 85 | Temp 99.2°F | Resp 16 | Ht 62.5 in | Wt 125.4 lb

## 2012-07-22 DIAGNOSIS — A749 Chlamydial infection, unspecified: Secondary | ICD-10-CM

## 2012-07-22 DIAGNOSIS — Z113 Encounter for screening for infections with a predominantly sexual mode of transmission: Secondary | ICD-10-CM

## 2012-07-22 LAB — RPR

## 2012-07-22 LAB — HEPATITIS C ANTIBODY: HCV Ab: NEGATIVE

## 2012-07-22 LAB — HIV ANTIBODY (ROUTINE TESTING W REFLEX): HIV: NONREACTIVE

## 2012-07-22 LAB — HEPATITIS B SURFACE ANTIGEN: Hepatitis B Surface Ag: NEGATIVE

## 2012-07-22 MED ORDER — AZITHROMYCIN 250 MG PO TABS: ORAL_TABLET | ORAL | Status: DC

## 2012-07-22 NOTE — Telephone Encounter (Signed)
Spoke with patient regarding labs. Will give azithromcyin 250 mg X 4 pills x 1. She will come in for other STD screening

## 2012-07-22 NOTE — Telephone Encounter (Signed)
Please call back to give lab results.   Thank you! [phone]

## 2012-07-22 NOTE — Progress Notes (Signed)
[facility]:  Office Visit  Chief Complaint:  Chief Complaint  Patient presents with  . Follow-up    HPI: Krista Kidd is a 27 y.o. female who is here for STD screening, the rest of the STD panel. She was + for chlamydia.   Past Medical History  Diagnosis Date  . Abnormal Pap smear 08/2009  . Chlamydia   . Gonorrhea   . HPV in female   . Headache, migraine   . GERD (gastroesophageal reflux disease)    Past Surgical History  Procedure Laterality Date  . Colposcopy     History   Social History  . Marital Status: Married    Spouse Name: N/A    Number of Children: N/A  . Years of Education: N/A   Social History Main Topics  . Smoking status: Never Smoker   . Smokeless tobacco: None  . Alcohol Use: Yes     Comment: occasional  . Drug Use: No  . Sexually Active: Yes    Birth Control/ Protection: None, Condom, Pill   Other Topics Concern  . None   Social History Narrative  . None   Family History  Problem Relation Age of Onset  . Hypertension Paternal Grandmother   . Diabetes Paternal Grandmother   . Hypertension Maternal Grandmother   . Diabetes Maternal Grandmother   . Hypertension Maternal Grandfather   . Diabetes Maternal Grandfather   . Hypertension Mother    Allergies  Allergen Reactions  . Penicillins Hives   Prior to Admission medications   Medication Sig Start Date End Date Taking? Authorizing Provider  azithromycin (ZITHROMAX) 250 MG tablet Take all 4 tabs PO now. 07/22/12   Saudia Smyser P Valbona Slabach, DO  ibuprofen (ADVIL,MOTRIN) 200 MG tablet Take 200 mg by mouth every 6 (six) hours as needed.    Historical Provider, MD  Multiple Vitamins-Minerals (ZINC PO) Take by mouth.    Historical Provider, MD  Norethindrone-Ethinyl Estradiol-Fe Biphas (LO LOESTRIN FE) 1 MG-10 MCG / 10 MCG tablet Take 1 tablet by mouth daily. 03/09/12   Naima A Dillard, MD  VITAMIN A PO Take by mouth.    Historical Provider, MD     ROS: The patient denies fevers,  chills, night sweats, unintentional weight loss, chest pain, palpitations, wheezing, dyspnea on exertion, nausea, vomiting, abdominal pain, dysuria, hematuria, melena, numbness, weakness, or tingling.   All other systems have been reviewed and were otherwise negative with the exception of those mentioned in the HPI and as above.    PHYSICAL EXAM: Filed Vitals:   07/22/12 1411  BP: 115/69  Pulse: 85  Temp: 99.2 F (37.3 C)  Resp: 16   Filed Vitals:   07/22/12 1411  Height: 5' 2.5" (1.588 m)  Weight: 125 lb 6.4 oz (56.881 kg)   Body mass index is 22.56 kg/(m^2).  General: Alert, no acute distress, slightly tearful HEENT:  Normocephalic, atraumatic, oropharynx patent.  Cardiovascular:  Regular rate and rhythm, no rubs murmurs or gallops.  No Carotid bruits, radial pulse intact. No pedal edema.  Respiratory: Clear to auscultation bilaterally.  No wheezes, rales, or rhonchi.  No cyanosis, no use of accessory musculature GI: No organomegaly, abdomen is soft and non-tender, positive bowel sounds.  No masses. Skin: No rashes. Neurologic: Facial musculature symmetric. Psychiatric: Patient is appropriate throughout our interaction. Lymphatic: No cervical lymphadenopathy Musculoskeletal: Gait intact.   LABS: Results for orders placed in visit on 07/17/12  GC/CHLAMYDIA PROBE AMP      Result Value  Range   CT Probe RNA POSITIVE (*)    GC Probe RNA NEGATIVE    POCT UA - MICROSCOPIC ONLY      Result Value Range   WBC, Ur, HPF, POC 1-4     RBC, urine, microscopic 0-3     Bacteria, U Microscopic trace     Mucus, UA trace     Epithelial cells, urine per micros 0-4     Crystals, Ur, HPF, POC neg     Casts, Ur, LPF, POC neg     Yeast, UA neg    POCT WET PREP WITH KOH      Result Value Range   Trichomonas, UA Negative     Clue Cells Wet Prep HPF POC neg     Epithelial Wet Prep HPF POC 1-3     Yeast Wet Prep HPF POC neg     Bacteria Wet Prep HPF POC 2     RBC Wet Prep HPF POC neg      WBC Wet Prep HPF POC 0-3     KOH Prep POC Negative       EKG/XRAY:   Primary read interpreted by Dr. Conley Rolls at [facility].   ASSESSMENT/PLAN: Encounter Diagnosis  Name Primary?  . Screening for STD (sexually transmitted disease) Yes    Had already called in for azithromcyin 250 mg X 4 pills She wants the rest of the STD panel Advise to practice safe sex F/u prn   Jacobb Alen PHUONG, DO 07/22/2012 3:13 PM

## 2012-07-22 NOTE — Telephone Encounter (Signed)
Please review Dr Conley Rolls.

## 2012-07-25 LAB — HSV(HERPES SIMPLEX VRS) I + II AB-IGG
HSV 1 Glycoprotein G Ab, IgG: 0.22 IV
HSV 2 Glycoprotein G Ab, IgG: <0.1 IV

## 2012-07-25 LAB — HEPATITIS B SURFACE ANTIBODY, QUANTITATIVE: Hep B S AB Quant (Post): 54.3 m[IU]/mL

## 2012-09-05 ENCOUNTER — Ambulatory Visit (INDEPENDENT_AMBULATORY_CARE_PROVIDER_SITE_OTHER): Payer: PRIVATE HEALTH INSURANCE | Admitting: Physician Assistant

## 2012-09-05 ENCOUNTER — Other Ambulatory Visit: Payer: Self-pay | Admitting: Family Medicine

## 2012-09-05 ENCOUNTER — Other Ambulatory Visit: Payer: Self-pay | Admitting: Emergency Medicine

## 2012-09-05 VITALS — BP 114/72 | HR 81 | Temp 98.0°F | Resp 17 | Ht 61.5 in | Wt 128.0 lb

## 2012-09-05 DIAGNOSIS — Z113 Encounter for screening for infections with a predominantly sexual mode of transmission: Secondary | ICD-10-CM

## 2012-09-05 DIAGNOSIS — N760 Acute vaginitis: Secondary | ICD-10-CM

## 2012-09-05 DIAGNOSIS — N898 Other specified noninflammatory disorders of vagina: Secondary | ICD-10-CM

## 2012-09-05 LAB — POCT UA - MICROSCOPIC ONLY
Casts, Ur, LPF, POC: NEGATIVE
Crystals, Ur, HPF, POC: NEGATIVE

## 2012-09-05 LAB — POCT WET PREP WITH KOH
KOH Prep POC: NEGATIVE
Trichomonas, UA: NEGATIVE
Yeast Wet Prep HPF POC: NEGATIVE

## 2012-09-05 LAB — POCT URINALYSIS DIPSTICK
Ketones, UA: NEGATIVE
Protein, UA: NEGATIVE
Spec Grav, UA: 1.02
Urobilinogen, UA: 1

## 2012-09-05 MED ORDER — METRONIDAZOLE 500 MG PO TABS: 500.0000 mg | ORAL_TABLET | Freq: Two times a day (BID) | ORAL | Status: DC

## 2012-09-05 NOTE — Progress Notes (Signed)
Subjective:    Patient ID: Krista Kidd, female    DOB: 08-May-1985, 27 y.o.   MRN: [id number]  HPI   [name] is a pleasant 27 yr old female here requesting STD testing.  She initially denies specific symptoms but then states she does have a slight vaginal discharge.  She denies itching, irritation, pain.  No abnormal bleeding. No urinary symptoms.  Last tested in May 2014, at that time +chlamydia and treated.  New partner since last testing.  Pt requests gc/chlamydia only today, declines HIV, RPR.  Currently has 1 female sexual partner.  States they use condoms about 90% of the time.  Has been with this partner about 1 month.  Partner does not have symptoms, but he does have another sexual partner.  OCPs for contraception.  States she takes faithfully every day.  Does not have periods on OCPs, so LMP unknown.     Review of Systems  Constitutional: Negative for fever and chills.  HENT: Negative.   Respiratory: Negative.   Cardiovascular: Negative.   Gastrointestinal: Negative for nausea, vomiting and abdominal pain.  Genitourinary: Positive for vaginal discharge. Negative for dysuria, frequency, hematuria, vaginal bleeding, genital sores, vaginal pain, menstrual problem and pelvic pain.  Musculoskeletal: Negative.   Skin: Negative.   Neurological: Negative.        Objective:   Physical Exam  Vitals reviewed. Constitutional: She is oriented to person, place, and time. She appears well-developed and well-nourished. No distress.  HENT:  Head: Normocephalic and atraumatic.  Eyes: Conjunctivae are normal. No scleral icterus.  Cardiovascular: Normal rate, regular rhythm and normal heart sounds.   Pulmonary/Chest: Effort normal and breath sounds normal. She has no wheezes. She has no rales.  Abdominal: Soft. There is no tenderness.  Genitourinary: Uterus normal. There is no rash, tenderness or lesion on the right labia. There is no rash or tenderness on the left labia. Cervix exhibits  discharge (thick, yellow). Cervix exhibits no motion tenderness and no friability. Right adnexum displays no mass, no tenderness and no fullness. Left adnexum displays no mass, no tenderness and no fullness. Vaginal discharge (slight, white) found.  Neurological: She is alert and oriented to person, place, and time.  Skin: Skin is warm and dry.  Psychiatric: She has a normal mood and affect. Her behavior is normal.     Results for orders placed in visit on 09/05/12  POCT UA - MICROSCOPIC ONLY      Result Value Range   WBC, Ur, HPF, POC 0-1     RBC, urine, microscopic 0-2     Bacteria, U Microscopic small     Mucus, UA small     Epithelial cells, urine per micros 0-2     Crystals, Ur, HPF, POC neg     Casts, Ur, LPF, POC neg     Yeast, UA neg    POCT URINALYSIS DIPSTICK      Result Value Range   Color, UA yellow     Clarity, UA clear     Glucose, UA neg     Bilirubin, UA neg     Ketones, UA neg     Spec Grav, UA 1.020     Blood, UA trace     pH, UA 7.5     Protein, UA neg     Urobilinogen, UA 1.0     Nitrite, UA neg     Leukocytes, UA Negative    POCT WET PREP WITH KOH      Result  Value Range   Trichomonas, UA Negative     Clue Cells Wet Prep HPF POC 1-2     Epithelial Wet Prep HPF POC 2-3     Yeast Wet Prep HPF POC neg     Bacteria Wet Prep HPF POC 2+     RBC Wet Prep HPF POC 0-2     WBC Wet Prep HPF POC 4-8     KOH Prep POC Negative          Assessment & Plan:  Bacterial vaginosis - Plan: metroNIDAZOLE (FLAGYL) 500 MG tablet  Vaginal discharge - Plan: POCT UA - Microscopic Only, POCT urinalysis dipstick, POCT Wet Prep with KOH, GC/Chlamydia Probe Amp  Screen for STD (sexually transmitted disease) - Plan: POCT Wet Prep with KOH, GC/Chlamydia Probe Amp   [name] is a pleasant 27 yr old female here complaining of vaginal discharge and requesting STI testing.  Wet prep reveals BV.  Will treat with Flagyl x 7 days.  Discussed avoidance of etoh.  Genprobe pending.   Will follow up on results and treat if necessary.  Pt declines HIV, RPR today though I recommend this due to unprotected sex with new partner.  Encouraged her to use condoms with every sexual encounter.  RTC if symptoms worsen or do not improve.

## 2012-09-05 NOTE — Patient Instructions (Addendum)
Begin taking the metronidazole (Flagyl) as directed.  Be sure to finish the full course.  DO NOT DRINK ALCOHOL WHILE TAKING THIS MEDICATION or for 48 hours after your last dose.  I will let you know when the other labs are back and if we need to treat anything at the at time.  Use condoms with every sexual encounter as this is the best way to protect yourself from sexually transmitted infections.     Bacterial Vaginosis Bacterial vaginosis (BV) is a vaginal infection where the normal balance of bacteria in the vagina is disrupted. The normal balance is then replaced by an overgrowth of certain bacteria. There are several different kinds of bacteria that can cause BV. BV is the most common vaginal infection in women of childbearing age. CAUSES   The cause of BV is not fully understood. BV develops when there is an increase or imbalance of harmful bacteria.  Some activities or behaviors can upset the normal balance of bacteria in the vagina and put women at increased risk including:  Having a new sex partner or multiple sex partners.  Douching.  Using an intrauterine device (IUD) for contraception.  It is not clear what role sexual activity plays in the development of BV. However, women that have never had sexual intercourse are rarely infected with BV. Women do not get BV from toilet seats, bedding, swimming pools or from touching objects around them.  SYMPTOMS   Grey vaginal discharge.  A fish-like odor with discharge, especially after sexual intercourse.  Itching or burning of the vagina and vulva.  Burning or pain with urination.  Some women have no signs or symptoms at all. DIAGNOSIS  Your caregiver must examine the vagina for signs of BV. Your caregiver will perform lab tests and look at the sample of vaginal fluid through a microscope. They will look for bacteria and abnormal cells (clue cells), a pH test higher than 4.5, and a positive amine test all associated with BV.  RISKS  AND COMPLICATIONS   Pelvic inflammatory disease (PID).  Infections following gynecology surgery.  Developing HIV.  Developing herpes virus. TREATMENT  Sometimes BV will clear up without treatment. However, all women with symptoms of BV should be treated to avoid complications, especially if gynecology surgery is planned. Female partners generally do not need to be treated. However, BV may spread between female sex partners so treatment is helpful in preventing a recurrence of BV.   BV may be treated with antibiotics. The antibiotics come in either pill or vaginal cream forms. Either can be used with nonpregnant or pregnant women, but the recommended dosages differ. These antibiotics are not harmful to the baby.  BV can recur after treatment. If this happens, a second round of antibiotics will often be prescribed.  Treatment is important for pregnant women. If not treated, BV can cause a premature delivery, especially for a pregnant woman who had a premature birth in the past. All pregnant women who have symptoms of BV should be checked and treated.  For chronic reoccurrence of BV, treatment with a type of prescribed gel vaginally twice a week is helpful. HOME CARE INSTRUCTIONS   Finish all medication as directed by your caregiver.  Do not have sex until treatment is completed.  Tell your sexual partner that you have a vaginal infection. They should see their caregiver and be treated if they have problems, such as a mild rash or itching.  Practice safe sex. Use condoms. Only have 1 sex partner.  PREVENTION  Basic prevention steps can help reduce the risk of upsetting the natural balance of bacteria in the vagina and developing BV:  Do not have sexual intercourse (be abstinent).  Do not douche.  Use all of the medicine prescribed for treatment of BV, even if the signs and symptoms go away.  Tell your sex partner if you have BV. That way, they can be treated, if needed, to prevent  reoccurrence. SEEK MEDICAL CARE IF:   Your symptoms are not improving after 3 days of treatment.  You have increased discharge, pain, or fever. MAKE SURE YOU:   Understand these instructions.  Will watch your condition.  Will get help right away if you are not doing well or get worse. FOR MORE INFORMATION  Division of STD Prevention (DSTDP), Centers for Disease Control and Prevention: [employer] American Social Health Association (ASHA): www.ashastd.org  Document Released: 02/15/2005 Document Revised: 05/10/2011 Document Reviewed: 08/08/2008 [facility] Patient Information 2014 [place], [place].   Safe Sex Safe sex is about reducing the risk of giving or getting a sexually transmitted disease (STD). STDs are spread through sexual contact involving the genitals, mouth, or rectum. Some STDS can be cured and others cannot. Safe sex can also prevent unintended pregnancies.  SAFE SEX PRACTICES  Limit your sexual activity to only one partner who is only having sex with you.  Talk to your partner about their past partners, past STDs, and drug use.  Use a condom every time you have sexual intercourse. This includes vaginal, oral, and anal sexual activity. Both females and males should wear condoms during oral sex. Only use latex or polyurethane condoms and water-based lubricants. Petroleum-based lubricants or oils used to lubricate a condom will weaken the condom and increase the chance that it will break. The condom should be in place from the beginning to the end of sexual activity. Wearing a condom reduces, but does not completely eliminate, your risk of getting or giving a STD. STDs can be spread by contact with skin of surrounding areas.  Get vaccinated for hepatitis B and HPV.  Avoid alcohol and recreational drugs which can affect your judgement. You may forget to use a condom or participate in high-risk sex.  For females, avoid douching after sexual intercourse. Douching can spread  an infection farther into the reproductive tract.  Check your body for signs of sores, blisters, rashes, or unusual discharge. See your caregiver if you notice any of these signs.  Avoid sexual contact if you have symptoms of an infection or are being treated for an STD. If you or your partner has herpes, avoid sexual contact when blisters are present. Use condoms at all other times.  See your caregiver for regular screenings, examinations, and tests for STDs. Before having sex with a new partner, each of you should be screened for STDs and talk about the results with your partner. BENEFITS OF SAFE SEX   There is less of a chance of getting or giving an STD.  You can prevent unwanted or unintended pregnancies.  By discussing safer sex concerns with your partner, you may increase feelings of intimacy, comfort, trust, and honesty between the both of you. Document Released: 03/25/2004 Document Revised: 11/10/2011 Document Reviewed: 08/09/2011 [facility] Patient Information 2014 [place], [place].

## 2012-09-07 LAB — GC/CHLAMYDIA PROBE AMP: CT Probe RNA: NEGATIVE

## 2012-10-18 ENCOUNTER — Ambulatory Visit (INDEPENDENT_AMBULATORY_CARE_PROVIDER_SITE_OTHER): Payer: PRIVATE HEALTH INSURANCE | Admitting: Physician Assistant

## 2012-10-18 VITALS — BP 110/68 | HR 70 | Temp 99.4°F | Resp 18 | Ht 61.5 in | Wt 129.0 lb

## 2012-10-18 DIAGNOSIS — M545 Low back pain: Secondary | ICD-10-CM

## 2012-10-18 DIAGNOSIS — R195 Other fecal abnormalities: Secondary | ICD-10-CM

## 2012-10-18 LAB — POCT CBC
Hemoglobin: 12.8 g/dL (ref 12.2–16.2)
MID (cbc): 0.4 (ref 0–0.9)
MPV: 8.3 fL (ref 0–99.8)
POC Granulocyte: 3.4 (ref 2–6.9)
POC MID %: 5.8 %M (ref 0–12)
Platelet Count, POC: 260 10*3/uL (ref 142–424)
RBC: 4.06 M/uL (ref 4.04–5.48)

## 2012-10-18 LAB — POCT URINALYSIS DIPSTICK
Glucose, UA: NEGATIVE
Leukocytes, UA: NEGATIVE
Nitrite, UA: NEGATIVE
Protein, UA: NEGATIVE
Spec Grav, UA: 1.025
Urobilinogen, UA: 1

## 2012-10-18 LAB — IFOBT (OCCULT BLOOD): IFOBT: NEGATIVE

## 2012-10-18 MED ORDER — MELOXICAM 15 MG PO TABS: 15.0000 mg | ORAL_TABLET | Freq: Every day | ORAL | Status: DC

## 2012-10-18 MED ORDER — CYCLOBENZAPRINE HCL 5 MG PO TABS: 5.0000 mg | ORAL_TABLET | Freq: Three times a day (TID) | ORAL | Status: DC | PRN

## 2012-10-18 NOTE — Patient Instructions (Addendum)
Your exam and labs today are reassuring.  I will have the rest of your labs back within the next couple of days and will let you know the results when I have them and what we need to do next.  If everything is normal and you still have symptoms, I think it would be reasonable to treat you with an antibiotic to cover for a parasite called giardia.  Try the meloxicam (Mobic) once daily to help with back pain and inflammation - do not take any additional ibuprofen or aleve with this medicine.  If you need more pain relief, you can take tylenol.  Use the cyclobenzaprine (Flexeril) every 8 hours as needed for muscle spasms - this may make you sleepy so be careful with the first dose.  If your pain worsens or persists, please let us know   Back Pain, Adult Low back pain is very common. About 1 in 5 people have back pain.The cause of low back pain is rarely dangerous. The pain often gets better over time.About half of people with a sudden onset of back pain feel better in just 2 weeks. About 8 in 10 people feel better by 6 weeks.  CAUSES Some common causes of back pain include:  Strain of the muscles or ligaments supporting the spine.  Wear and tear (degeneration) of the spinal discs.  Arthritis.  Direct injury to the back. DIAGNOSIS Most of the time, the direct cause of low back pain is not known.However, back pain can be treated effectively even when the exact cause of the pain is unknown.Answering your caregiver's questions about your overall health and symptoms is one of the most accurate ways to make sure the cause of your pain is not dangerous. If your caregiver needs more information, he or she may order lab work or imaging tests (X-rays or MRIs).However, even if imaging tests show changes in your back, this usually does not require surgery. HOME CARE INSTRUCTIONS For many people, back pain returns.Since low back pain is rarely dangerous, it is often a condition that people can learn to  [facility] their own.   Remain active. It is stressful on the back to sit or stand in one place. Do not sit, drive, or stand in one place for more than 30 minutes at a time. Take short walks on level surfaces as soon as pain allows.Try to increase the length of time you walk each day.  Do not stay in bed.Resting more than 1 or 2 days can delay your recovery.  Do not avoid exercise or work.Your body is made to move.It is not dangerous to be active, even though your back may hurt.Your back will likely heal faster if you return to being active before your pain is gone.  Pay attention to your body when you bend and lift. Many people have less discomfortwhen lifting if they bend their knees, keep the load close to their bodies,and avoid twisting. Often, the most comfortable positions are those that put less stress on your recovering back.  Find a comfortable position to sleep. Use a firm mattress and lie on your side with your knees slightly bent. If you lie on your back, put a pillow under your knees.  Only take over-the-counter or prescription medicines as directed by your caregiver. Over-the-counter medicines to reduce pain and inflammation are often the most helpful.Your caregiver may prescribe muscle relaxant drugs.These medicines help dull your pain so you can more quickly return to your normal activities and healthy exercise.  Put ice on the injured area.  Put ice in a plastic bag.  Place a towel between your skin and the bag.  Leave the ice on for 15-20 minutes, 3-4 times a day for the first 2 to 3 days. After that, ice and heat may be alternated to reduce pain and spasms.  Ask your caregiver about trying back exercises and gentle massage. This may be of some benefit.  Avoid feeling anxious or stressed.Stress increases muscle tension and can worsen back pain.It is important to recognize when you are anxious or stressed and learn ways to manage it.Exercise is a great  option. SEEK MEDICAL CARE IF:  You have pain that is not relieved with rest or medicine.  You have pain that does not improve in 1 week.  You have new symptoms.  You are generally not feeling well. SEEK IMMEDIATE MEDICAL CARE IF:   You have pain that radiates from your back into your legs.  You develop new bowel or bladder control problems.  You have unusual weakness or numbness in your arms or legs.  You develop nausea or vomiting.  You develop abdominal pain.  You feel faint. Document Released: 02/15/2005 Document Revised: 08/17/2011 Document Reviewed: 07/06/2010 [facility] Patient Information 2014 [place], [place].

## 2012-10-18 NOTE — Progress Notes (Signed)
Subjective:    Patient ID: Krista Kidd, female    DOB: 07/12/1985, 27 y.o.   MRN: [id number]  HPI   [name] is a very pleasant 27 yr old female here desiring to have her "liver and kidneys checked."  Reports she began having clay colored and foul smelling stools 3 days ago.  Describes them as "chalky white" in color.  Yesterday and today also mixed with some green.  Has never had this before.  Denies diarrhea - but reports she usually only has 1-2 bowel movements per week, so she is going more frequently than normal.  Some abd discomfort today.  No NV.  No belching, flatulence.  No fever or chills.  No sick contacts.  Travel to [place] in June but none recently.  No personal or family hx gallbladder problems.  Appetite normal.  Minimal etoh - 1 drink 2-3 times per month.  +blood in stool yesterday, no dark tarry stools, no mucus.  No new foods or dietary changes.  LMP - does not have periods on OCP.  Also complains of over a week of back pain.  Started left side, now right side as well.  NKI.     Review of Systems  Constitutional: Negative for fever, chills, appetite change and unexpected weight change.  HENT: Negative.   Respiratory: Negative.   Cardiovascular: Negative.   Gastrointestinal: Positive for abdominal pain and blood in stool. Negative for nausea, vomiting, diarrhea, constipation and abdominal distention.  Genitourinary: Negative.   Musculoskeletal: Positive for back pain.  Skin: Negative.   Neurological: Negative.        Objective:   Physical Exam  Vitals reviewed. Constitutional: She is oriented to person, place, and time. She appears well-developed and well-nourished. No distress.  HENT:  Head: Normocephalic and atraumatic.  Eyes: Conjunctivae are normal. No scleral icterus.  Cardiovascular: Normal rate, regular rhythm and normal heart sounds.   Pulmonary/Chest: Effort normal and breath sounds normal. She has no wheezes. She has no rales.  Abdominal: Soft. There is  tenderness in the periumbilical area and left upper quadrant. There is no rigidity, no rebound, no guarding, no CVA tenderness, no tenderness at McBurney's point and negative Murphy's sign.  Musculoskeletal:       Thoracic back: She exhibits tenderness, pain and spasm. She exhibits normal range of motion and no bony tenderness.       Lumbar back: She exhibits tenderness, pain and spasm. She exhibits normal range of motion and no bony tenderness.  SLR neg bilat  Neurological: She is alert and oriented to person, place, and time. She has normal reflexes.  Skin: Skin is warm and dry.  Psychiatric: She has a normal mood and affect. Her behavior is normal.    Results for orders placed in visit on 10/18/12  POCT CBC      Result Value Range   WBC 7.2  4.6 - 10.2 K/uL   Lymph, poc 3.4  0.6 - 3.4   POC LYMPH PERCENT 46.7  10 - 50 %L   MID (cbc) 0.4  0 - 0.9   POC MID % 5.8  0 - 12 %M   POC Granulocyte 3.4  2 - 6.9   Granulocyte percent 47.5  37 - 80 %G   RBC 4.06  4.04 - 5.48 M/uL   Hemoglobin 12.8  12.2 - 16.2 g/dL   HCT, POC 40.9  81.1 - 47.9 %   MCV 98.1 (*) 80 - 97 fL   MCH, POC 31.5 (*)  27 - 31.2 pg   MCHC 32.2  31.8 - 35.4 g/dL   RDW, POC 16.1     Platelet Count, POC 260  142 - 424 K/uL   MPV 8.3  0 - 99.8 fL  IFOBT (OCCULT BLOOD)      Result Value Range   IFOBT Negative    POCT URINALYSIS DIPSTICK      Result Value Range   Color, UA yellow     Clarity, UA hazy     Glucose, UA neg     Bilirubin, UA neg     Ketones, UA neg     Spec Grav, UA 1.025     Blood, UA trace-intact     pH, UA 6.5     Protein, UA neg     Urobilinogen, UA 1.0     Nitrite, UA neg     Leukocytes, UA Negative           Assessment & Plan:  Clay-colored stools - Plan: POCT CBC, IFOBT POC (occult bld, rslt in office), Comprehensive metabolic panel, Acute Hep Panel & Hep B Surface Ab, POCT urinalysis dipstick, CANCELED: Ova and parasite screen  --  [name] is a very pleasant 27 yr old female with 3  days of clay-colored stools.  Exam reveals some mild generalized abd tenderness but is otherwise normal.  Labs thus far are normal.  CMP and hep panel pending.  Pt questions doing imaging today.  Discussed with her that everything is reassuring thus far and I think it would premature to do any imaging today.  Will await labs and pursue imaging at that time if needed.  If labs normal but symptoms persist, would consider treating empirically for giardia.  Discussed all of this with pt.  If any symptoms acutely worsening, she should RTC.    Low back pain - Plan: meloxicam (MOBIC) 15 MG tablet, cyclobenzaprine (FLEXERIL) 5 MG tablet  -- Pt with 1 wk LBP with NKI.   Exam reveals tenderness and spasm of left thoracic/lumbar paraspinals.  Will try Mobic and Flexeril prn.  Encouraged movement and gentle stretching.  If pain worsens or persists, RTC for further eval.

## 2012-10-19 LAB — COMPREHENSIVE METABOLIC PANEL
ALT: 10 U/L (ref 0–35)
AST: 14 U/L (ref 0–37)
Albumin: 4.1 g/dL (ref 3.5–5.2)
Alkaline Phosphatase: 50 U/L (ref 39–117)
BUN: 10 mg/dL (ref 6–23)
Calcium: 9.2 mg/dL (ref 8.4–10.5)
Chloride: 106 mEq/L (ref 96–112)
Creat: 0.69 mg/dL (ref 0.50–1.10)
Potassium: 4.2 mEq/L (ref 3.5–5.3)

## 2012-10-20 LAB — ACUTE HEP PANEL AND HEP B SURFACE AB
Hep A IgM: NEGATIVE
Hep B S Ab: POSITIVE — AB

## 2012-10-21 ENCOUNTER — Encounter: Payer: Self-pay | Admitting: Physician Assistant

## 2012-10-21 DIAGNOSIS — R110 Nausea: Secondary | ICD-10-CM

## 2012-11-22 ENCOUNTER — Ambulatory Visit (INDEPENDENT_AMBULATORY_CARE_PROVIDER_SITE_OTHER): Payer: Managed Care, Other (non HMO) | Admitting: Family Medicine

## 2012-11-22 VITALS — BP 100/60 | HR 76 | Temp 99.5°F | Resp 16 | Ht 61.5 in | Wt 127.8 lb

## 2012-11-22 DIAGNOSIS — R350 Frequency of micturition: Secondary | ICD-10-CM

## 2012-11-22 DIAGNOSIS — B373 Candidiasis of vulva and vagina: Secondary | ICD-10-CM

## 2012-11-22 DIAGNOSIS — Z113 Encounter for screening for infections with a predominantly sexual mode of transmission: Secondary | ICD-10-CM

## 2012-11-22 DIAGNOSIS — N898 Other specified noninflammatory disorders of vagina: Secondary | ICD-10-CM

## 2012-11-22 LAB — POCT UA - MICROSCOPIC ONLY: Yeast, UA: NEGATIVE

## 2012-11-22 LAB — POCT WET PREP WITH KOH
Trichomonas, UA: NEGATIVE
Yeast Wet Prep HPF POC: NEGATIVE

## 2012-11-22 LAB — POCT URINALYSIS DIPSTICK
Bilirubin, UA: NEGATIVE
Glucose, UA: NEGATIVE
Ketones, UA: NEGATIVE
Leukocytes, UA: NEGATIVE
Nitrite, UA: NEGATIVE
pH, UA: 6

## 2012-11-22 MED ORDER — FLUCONAZOLE 150 MG PO TABS: 150.0000 mg | ORAL_TABLET | Freq: Once | ORAL | Status: DC

## 2012-11-22 NOTE — Progress Notes (Signed)
[address]   [place], [place]  [address]   [phone]  Subjective:    Patient ID: Krista Kidd, female    DOB: 06/28/1985, 27 y.o.   MRN: [id number]  HPI This 27 y.o. female presents for evaluation of two day history of vaginal discharge.  Intermittent discharge; white; no itching or burning.  No pain in vaginal area.  Concerned about yeast infection. Requesting STD screening.  No new concerns; gets every 4-5 months to be safe. History of STDs (chlamydia). Sexually active.  Total partners = 5. Condoms sporadically. OCP daily; remembering well.   No fever/chills/sweats; no abdominal pain; no n/v/d.  No back pain.  No dysuria, +urinary frequency.  Last pap smear 06/2012.  [facility].   Gardisil series in past.   Review of Systems  Constitutional: Negative for fever, chills, diaphoresis and fatigue.  Gastrointestinal: Negative for nausea, vomiting and abdominal pain.  Genitourinary: Positive for frequency and vaginal discharge. Negative for dysuria, urgency, hematuria, flank pain, decreased urine volume, genital sores, vaginal pain, menstrual problem and pelvic pain.   Past Medical History  Diagnosis Date  . Abnormal Pap smear 08/2009  . Chlamydia   . Gonorrhea   . HPV in female   . Headache, migraine   . GERD (gastroesophageal reflux disease)   . Chlamydia   . Allergy    Past Surgical History  Procedure Laterality Date  . Colposcopy     Allergies  Allergen Reactions  . Penicillins Hives   Current Outpatient Prescriptions on File Prior to Visit  Medication Sig Dispense Refill  . Norethindrone-Ethinyl Estradiol-Fe Biphas (LO LOESTRIN FE) 1 MG-10 MCG / 10 MCG tablet Take 1 tablet by mouth daily.  1 Package  3  . cyclobenzaprine (FLEXERIL) 5 MG tablet Take 1 tablet (5 mg total) by mouth 3 (three) times daily as needed for muscle spasms.  30 tablet  1  . meloxicam (MOBIC) 15 MG tablet Take 1 tablet (15 mg total) by mouth daily.  30 tablet  1   No current  facility-administered medications on file prior to visit.       Objective:   Physical Exam  Nursing note and vitals reviewed. Constitutional: She appears well-developed and well-nourished. No distress.  Cardiovascular: Normal rate and regular rhythm.   No murmur heard. Pulmonary/Chest: Effort normal and breath sounds normal.  Abdominal: Soft. Bowel sounds are normal. She exhibits no distension and no mass. There is no tenderness. There is no rebound and no guarding.  Genitourinary: Uterus normal. There is no rash, tenderness or lesion on the right labia. There is no rash, tenderness or lesion on the left labia. Cervix exhibits no motion tenderness, no discharge and no friability. Right adnexum displays no mass, no tenderness and no fullness. Left adnexum displays no mass, no tenderness and no fullness. No erythema or bleeding around the vagina. No foreign body around the vagina. Vaginal discharge found.  Scant white thick discharge in vaginal vault.  Skin: Skin is warm and dry. She is not diaphoretic.  Psychiatric: She has a normal mood and affect. Her behavior is normal.      Results for orders placed in visit on 11/22/12  POCT UA - MICROSCOPIC ONLY      Result Value Range   WBC, Ur, HPF, POC 0-4     RBC, urine, microscopic 0-2     Bacteria, U Microscopic 1+     Mucus, UA positive     Epithelial cells, urine per micros 4-6  Crystals, Ur, HPF, POC neg     Casts, Ur, LPF, POC neg     Yeast, UA neg    POCT URINALYSIS DIPSTICK      Result Value Range   Color, UA amber     Clarity, UA clear     Glucose, UA neg     Bilirubin, UA neg     Ketones, UA neg     Spec Grav, UA >=1.030     Blood, UA trace-lysed     pH, UA 6.0     Protein, UA neg     Urobilinogen, UA 0.2     Nitrite, UA neg     Leukocytes, UA Negative    POCT WET PREP WITH KOH      Result Value Range   Trichomonas, UA Negative     Clue Cells Wet Prep HPF POC 1-3     Epithelial Wet Prep HPF POC 4-10     Yeast  Wet Prep HPF POC neg     Bacteria Wet Prep HPF POC 2+     RBC Wet Prep HPF POC 0-1     WBC Wet Prep HPF POC 6-8     KOH Prep POC Negative         Assessment & Plan:  Screen for STD (sexually transmitted disease) - Plan: POCT UA - Microscopic Only, POCT urinalysis dipstick, POCT Wet Prep with KOH, GC/Chlamydia Probe Amp  Vaginal discharge - Plan: POCT Wet Prep with KOH, GC/Chlamydia Probe Amp  Urinary frequency - Plan: Urine culture  Candidiasis of vulva and vagina  1.  Vaginal discharge/Candidiasis:  New. Rx for Diflucan provided. 2.  STD screening/high risk sexual behavior:  New. Obtain GC/Chlam.  Safe sex practices reviewed; s/p Gardisil series in the past.  Meds ordered this encounter  Medications  . fluconazole (DIFLUCAN) 150 MG tablet    Sig: Take 1 tablet (150 mg total) by mouth once. Repeat if needed    Dispense:  2 tablet    Refill:  0

## 2012-11-23 LAB — GC/CHLAMYDIA PROBE AMP: GC Probe RNA: NEGATIVE

## 2012-11-23 LAB — URINE CULTURE

## 2012-11-28 NOTE — Addendum Note (Signed)
Addended by: Godfrey Pick on: 11/28/2012 12:57 PM   Modules accepted: Orders

## 2012-11-28 NOTE — Telephone Encounter (Signed)
Pt requests CT to evaluate 4 wks of nausea.  Discussed with pt that there is not really an indication for imaging, but pt has been persistent in requesting this since my last office visit with her in August 2014.  We have communicated via MyChart, and I have made it clear to her that there is not an indication for imaging and that insurance likely will not cover it, but she requests an order for CT anyway.  I have placed the order per her request.

## 2012-12-01 ENCOUNTER — Ambulatory Visit
Admission: RE | Admit: 2012-12-01 | Discharge: 2012-12-01 | Disposition: A | Discharge status: Home / Self Care | Payer: 59 | Source: Ambulatory Visit | Attending: Physician Assistant | Admitting: Physician Assistant

## 2012-12-01 DIAGNOSIS — R110 Nausea: Secondary | ICD-10-CM

## 2012-12-01 MED ORDER — IOHEXOL 300 MG/ML  SOLN
100.0000 mL | Freq: Once | INTRAMUSCULAR | Status: AC | PRN
  Administered 2012-12-01: 100 mL via INTRAVENOUS

## 2012-12-30 ENCOUNTER — Ambulatory Visit (INDEPENDENT_AMBULATORY_CARE_PROVIDER_SITE_OTHER): Payer: Managed Care, Other (non HMO) | Admitting: Emergency Medicine

## 2012-12-30 VITALS — BP 102/66 | HR 70 | Temp 98.0°F | Resp 16 | Ht 61.0 in | Wt 126.0 lb

## 2012-12-30 DIAGNOSIS — Z9189 Other specified personal risk factors, not elsewhere classified: Secondary | ICD-10-CM

## 2012-12-30 DIAGNOSIS — N760 Acute vaginitis: Secondary | ICD-10-CM

## 2012-12-30 DIAGNOSIS — Z202 Contact with and (suspected) exposure to infections with a predominantly sexual mode of transmission: Secondary | ICD-10-CM

## 2012-12-30 DIAGNOSIS — A499 Bacterial infection, unspecified: Secondary | ICD-10-CM

## 2012-12-30 DIAGNOSIS — B9689 Other specified bacterial agents as the cause of diseases classified elsewhere: Secondary | ICD-10-CM

## 2012-12-30 LAB — POCT UA - MICROSCOPIC ONLY
Casts, Ur, LPF, POC: NEGATIVE
Crystals, Ur, HPF, POC: NEGATIVE
Mucus, UA: POSITIVE

## 2012-12-30 LAB — POCT URINALYSIS DIPSTICK
Glucose, UA: NEGATIVE
Ketones, UA: NEGATIVE
Leukocytes, UA: NEGATIVE
Nitrite, UA: NEGATIVE
Spec Grav, UA: 1.025
pH, UA: 7

## 2012-12-30 LAB — POCT WET PREP WITH KOH
Clue Cells Wet Prep HPF POC: 100
RBC Wet Prep HPF POC: NEGATIVE
Trichomonas, UA: NEGATIVE
Yeast Wet Prep HPF POC: NEGATIVE

## 2012-12-30 MED ORDER — METRONIDAZOLE 500 MG PO TABS: 500.0000 mg | ORAL_TABLET | Freq: Two times a day (BID) | ORAL | Status: DC

## 2012-12-30 NOTE — Progress Notes (Signed)
[facility] [address] [place] [address] [phone]  Date:  12/30/2012   Name:  Krista Kidd   DOB:  03-19-1985   MRN:  [id number]  PCP:  Pcp Not In System    Chief Complaint: Exposure to STD   History of Present Illness:  Krista Kidd is a 27 y.o. very pleasant female patient who presents with the following:  Found out sexual partner is seeing someone else.  She wants to be checked for [facility], [place] and trich.  Does not wish blood tests for HIV and syphillis.  Currently asymptomatic. No discharge, dyspareunia, pain or other complaints. Denies other complaint or health concern today.   Patient Active Problem List   Diagnosis Date Noted  . Vaginal discharge 02/08/2012    Past Medical History  Diagnosis Date  . Abnormal Pap smear 08/2009  . Chlamydia   . Gonorrhea   . HPV in female   . Headache, migraine   . GERD (gastroesophageal reflux disease)   . Chlamydia   . Allergy     Past Surgical History  Procedure Laterality Date  . Colposcopy      History  Substance Use Topics  . Smoking status: Never Smoker   . Smokeless tobacco: Not on file  . Alcohol Use: Yes     Comment: occasional    Family History  Problem Relation Age of Onset  . Hypertension Paternal Grandmother   . Diabetes Paternal Grandmother   . Hypertension Maternal Grandmother   . Diabetes Maternal Grandmother   . Cancer Maternal Grandmother   . Hypertension Maternal Grandfather   . Diabetes Maternal Grandfather   . Hypertension Mother     Allergies  Allergen Reactions  . Penicillins Hives    Medication list has been reviewed and updated.  Current Outpatient Prescriptions on File Prior to Visit  Medication Sig Dispense Refill  . Norethindrone-Ethinyl Estradiol-Fe Biphas (LO LOESTRIN FE) 1 MG-10 MCG / 10 MCG tablet Take 1 tablet by mouth daily.  1 Package  3   No current facility-administered medications on file prior to visit.    Review of Systems:  As per  HPI, otherwise negative.    Physical Examination: Filed Vitals:   12/30/12 1447  BP: 102/66  Pulse: 70  Temp: 98 F (36.7 C)  Resp: 16   Filed Vitals:   12/30/12 1447  Height: 5\' 1"  (1.549 m)  Weight: 126 lb (57.153 kg)   Body mass index is 23.82 kg/(m^2). Ideal Body Weight: Weight in (lb) to have BMI = 25: 132   GEN: WDWN, NAD, Non-toxic, Alert & Oriented x 3 HEENT: Atraumatic, Normocephalic.  Ears and Nose: No external deformity. EXTR: No clubbing/cyanosis/edema NEURO: Normal gait.  PSYCH: Normally interactive. Conversant. Not depressed or anxious appearing.  Calm demeanor.    Assessment and Plan: STD check Labs   Signed,  Phillips Odor, MD   Results for orders placed in visit on 12/30/12  POCT WET PREP WITH KOH      Result Value Range   Trichomonas, UA Negative     Clue Cells Wet Prep HPF POC 100%     Epithelial Wet Prep HPF POC 0-2     Yeast Wet Prep HPF POC neg     Bacteria Wet Prep HPF POC 2+     RBC Wet Prep HPF POC neg     WBC Wet Prep HPF POC 0-1     KOH Prep POC Negative    POCT  URINALYSIS DIPSTICK      Result Value Range   Color, UA dark yellow     Clarity, UA slightly cloudy     Glucose, UA neg     Bilirubin, UA small     Ketones, UA neg     Spec Grav, UA 1.025     Blood, UA trace-intact     pH, UA 7.0     Protein, UA 30     Urobilinogen, UA 1.0     Nitrite, UA neg     Leukocytes, UA Negative    POCT UA - MICROSCOPIC ONLY      Result Value Range   WBC, Ur, HPF, POC neg     RBC, urine, microscopic 0-2     Bacteria, U Microscopic trace     Mucus, UA positive     Epithelial cells, urine per micros 0-1     Crystals, Ur, HPF, POC neg     Casts, Ur, LPF, POC neg     Yeast, UA neg

## 2013-01-01 LAB — GC/CHLAMYDIA PROBE AMP: GC Probe RNA: NEGATIVE

## 2013-01-04 ENCOUNTER — Other Ambulatory Visit: Payer: Self-pay

## 2013-04-02 ENCOUNTER — Ambulatory Visit (INDEPENDENT_AMBULATORY_CARE_PROVIDER_SITE_OTHER): Payer: BC Managed Care – PPO | Admitting: Family Medicine

## 2013-04-02 ENCOUNTER — Other Ambulatory Visit: Payer: Self-pay | Admitting: Family Medicine

## 2013-04-02 VITALS — BP 108/72 | HR 87 | Temp 98.3°F | Resp 16 | Ht 61.5 in | Wt 126.0 lb

## 2013-04-02 DIAGNOSIS — L293 Anogenital pruritus, unspecified: Secondary | ICD-10-CM

## 2013-04-02 DIAGNOSIS — R8290 Unspecified abnormal findings in urine: Secondary | ICD-10-CM

## 2013-04-02 DIAGNOSIS — B379 Candidiasis, unspecified: Secondary | ICD-10-CM

## 2013-04-02 DIAGNOSIS — N898 Other specified noninflammatory disorders of vagina: Secondary | ICD-10-CM

## 2013-04-02 DIAGNOSIS — R82998 Other abnormal findings in urine: Secondary | ICD-10-CM

## 2013-04-02 LAB — POCT URINALYSIS DIPSTICK
Bilirubin, UA: NEGATIVE
Glucose, UA: NEGATIVE
Ketones, UA: NEGATIVE
Nitrite, UA: NEGATIVE
Spec Grav, UA: >=1.03
Urobilinogen, UA: 0.2
pH, UA: 5.5

## 2013-04-02 LAB — POCT UA - MICROSCOPIC ONLY
Casts, Ur, LPF, POC: NEGATIVE
Crystals, Ur, HPF, POC: NEGATIVE
Mucus, UA: POSITIVE
Yeast, UA: NEGATIVE

## 2013-04-02 LAB — POCT WET PREP WITH KOH
Clue Cells Wet Prep HPF POC: NEGATIVE
KOH Prep POC: POSITIVE
RBC Wet Prep HPF POC: NEGATIVE
Trichomonas, UA: NEGATIVE
Yeast Wet Prep HPF POC: NEGATIVE

## 2013-04-02 MED ORDER — FLUCONAZOLE 150 MG PO TABS: 150.0000 mg | ORAL_TABLET | Freq: Once | ORAL | Status: DC

## 2013-04-02 NOTE — Patient Instructions (Signed)
Candida Infection, Adult A candida infection (also called yeast, fungus and Monilia infection) is an overgrowth of yeast that can occur anywhere on the body. A yeast infection commonly occurs in warm, moist body areas. Usually, the infection remains localized but can spread to become a systemic infection. A yeast infection may be a sign of a more severe disease such as diabetes, leukemia, or AIDS. A yeast infection can occur in both men and women. In women, Candida vaginitis is a vaginal infection. It is one of the most common causes of vaginitis. Men usually do not have symptoms or know they have an infection until other problems develop. Men may find out they have a yeast infection because their sex partner has a yeast infection. Uncircumcised men are more likely to get a yeast infection than circumcised men. This is because the uncircumcised glans is not exposed to air and does not remain as dry as that of a circumcised glans. Older adults may develop yeast infections around dentures. CAUSES  Women  Antibiotics.  Steroid medication taken for a long time.  Being overweight (obese).  Diabetes.  Poor immune condition.  Certain serious medical conditions.  Immune suppressive medications for organ transplant patients.  Chemotherapy.  Pregnancy.  Menstration.  Stress and fatigue.  Intravenous drug use.  Oral contraceptives.  Wearing tight-fitting clothes in the crotch area.  Catching it from a sex partner who has a yeast infection.  Spermicide.  Intravenous, urinary, or other catheters. Men  Catching it from a sex partner who has a yeast infection.  Having oral or anal sex with a person who has the infection.  Spermicide.  Diabetes.  Antibiotics.  Poor immune system.  Medications that suppress the immune system.  Intravenous drug use.  Intravenous, urinary, or other catheters. SYMPTOMS  Women  Thick, white vaginal discharge.  Vaginal itching.  Redness and  swelling in and around the vagina.  Irritation of the lips of the vagina and perineum.  Blisters on the vaginal lips and perineum.  Painful sexual intercourse.  Low blood sugar (hypoglycemia).  Painful urination.  Bladder infections.  Intestinal problems such as constipation, indigestion, bad breath, bloating, increase in gas, diarrhea, or loose stools. Men  Men may develop intestinal problems such as constipation, indigestion, bad breath, bloating, increase in gas, diarrhea, or loose stools.  Dry, cracked skin on the penis with itching or discomfort.  Jock itch.  Dry, flaky skin.  Athlete's foot.  Hypoglycemia. DIAGNOSIS  Women  A history and an exam are performed.  The discharge may be examined under a microscope.  A culture may be taken of the discharge. Men  A history and an exam are performed.  Any discharge from the penis or areas of cracked skin will be looked at under the microscope and cultured.  Stool samples may be cultured. TREATMENT  Women  Vaginal antifungal suppositories and creams.  Medicated creams to decrease irritation and itching on the outside of the vagina.  Warm compresses to the perineal area to decrease swelling and discomfort.  Oral antifungal medications.  Medicated vaginal suppositories or cream for repeated or recurrent infections.  Wash and dry the irritation areas before applying the cream.  Eating yogurt with lactobacillus may help with prevention and treatment.  Sometimes painting the vagina with gentian violet solution may help if creams and suppositories do not work. Men  Antifungal creams and oral antifungal medications.  Sometimes treatment must continue for 30 days after the symptoms go away to prevent recurrence. HOME CARE   INSTRUCTIONS  Women  Use cotton underwear and avoid tight-fitting clothing.  Avoid colored, scented toilet paper and deodorant tampons or pads.  Do not douche.  Keep your diabetes  under control.  Finish all the prescribed medications.  Keep your skin clean and dry.  Consume milk or yogurt with lactobacillus active culture regularly. If you get frequent yeast infections and think that is what the infection is, there are over-the-counter medications that you can get. If the infection does not show healing in 3 days, talk to your caregiver.  Tell your sex partner you have a yeast infection. Your partner may need treatment also, especially if your infection does not clear up or recurs. Men  Keep your skin clean and dry.  Keep your diabetes under control.  Finish all prescribed medications.  Tell your sex partner that you have a yeast infection so they can be treated if necessary. SEEK MEDICAL CARE IF:   Your symptoms do not clear up or worsen in one week after treatment.  You have an oral temperature above 102 F (38.9 C).  You have trouble swallowing or eating for a prolonged time.  You develop blisters on and around your vagina.  You develop vaginal bleeding and it is not your menstrual period.  You develop abdominal pain.  You develop intestinal problems as mentioned above.  You get weak or lightheaded.  You have painful or increased urination.  You have pain during sexual intercourse. MAKE SURE YOU:   Understand these instructions.  Will watch your condition.  Will get help right away if you are not doing well or get worse. Document Released: 03/25/2004 Document Revised: 05/10/2011 Document Reviewed: 07/07/2009 [employer] Patient Information 2014 [employer].  

## 2013-04-02 NOTE — Progress Notes (Signed)
Chief Complaint:  Chief Complaint  Patient presents with  . Vaginal Itching    x 1 day     HPI: Krista Kidd is a 28 y.o. female who is here for  Vaginal itching and burning x 1 day. Currently on birth control and rarely gets her period. She is coming off her period and this usually happens after her period. She states she thinks its yeast because the last time she was seen with these symptoms it was diagnosed as a yeast infection. Also wants urine tested to make sure she has no UTI.  Past Medical History  Diagnosis Date  . Abnormal Pap smear 08/2009  . Chlamydia   . Gonorrhea   . HPV in female   . Headache, migraine   . GERD (gastroesophageal reflux disease)   . Chlamydia   . Allergy    Past Surgical History  Procedure Laterality Date  . Colposcopy     History   Social History  . Marital Status: Married    Spouse Name: N/A    Number of Children: N/A  . Years of Education: N/A   Social History Main Topics  . Smoking status: Never Smoker   . Smokeless tobacco: None  . Alcohol Use: Yes     Comment: occasional  . Drug Use: No  . Sexual Activity: Yes    Birth Control/ Protection: None, Condom, Pill   Other Topics Concern  . None   Social History Narrative   Marital status:  Single; dating casually.      Children none      Employment:  Unemployed.      Tobacco; none       Alcohol:  Twice weekly      Drugs: none   Family History  Problem Relation Age of Onset  . Hypertension Paternal Grandmother   . Diabetes Paternal Grandmother   . Hypertension Maternal Grandmother   . Diabetes Maternal Grandmother   . Cancer Maternal Grandmother   . Hypertension Maternal Grandfather   . Diabetes Maternal Grandfather   . Hypertension Mother    Allergies  Allergen Reactions  . Penicillins Hives   Prior to Admission medications   Medication Sig Start Date End Date Taking? Authorizing Provider  Norethindrone-Ethinyl Estradiol-Fe Biphas (LO LOESTRIN FE) 1 MG-10  MCG / 10 MCG tablet Take 1 tablet by mouth daily. 03/09/12  Yes Naima A Dillard, MD  metroNIDAZOLE (FLAGYL) 500 MG tablet Take 1 tablet (500 mg total) by mouth 2 (two) times daily with a meal. DO NOT CONSUME ALCOHOL WHILE TAKING THIS MEDICATION. 12/30/12   Phillips Odor, MD     ROS: The patient denies fevers, chills, night sweats, unintentional weight loss, chest pain, palpitations, wheezing, dyspnea on exertion, nausea, vomiting, abdominal pain, dysuria, hematuria, melena, numbness, weakness, or tingling. + dc   All other systems have been reviewed and were otherwise negative with the exception of those mentioned in the HPI and as above.    PHYSICAL EXAM: Filed Vitals:   04/02/13 1414  BP: 108/72  Pulse: 87  Temp: 98.3 F (36.8 C)  Resp: 16   Filed Vitals:   04/02/13 1414  Height: 5' 1.5" (1.562 m)  Weight: 126 lb (57.153 kg)   Body mass index is 23.42 kg/(m^2).  General: Alert, no acute distress HEENT:  Normocephalic, atraumatic, oropharynx patent. EOMI, PERRLA Cardiovascular:  Regular rate and rhythm, no rubs murmurs or gallops.  No Carotid bruits, radial pulse intact. No pedal edema.  Respiratory: Clear to auscultation bilaterally.  No wheezes, rales, or rhonchi.  No cyanosis, no use of accessory musculature GI: No organomegaly, abdomen is soft and non-tender, positive bowel sounds.  No masses. Skin: No rashes. Neurologic: Facial musculature symmetric. Psychiatric: Patient is appropriate throughout our interaction. Lymphatic: No cervical lymphadenopathy Musculoskeletal: Gait intact. + thick white dc, no odor, pt has menses  LABS: Results for orders placed in visit on 04/02/13  URINE CULTURE      Result Value Range   Colony Count NO GROWTH     Organism ID, Bacteria NO GROWTH    POCT UA - MICROSCOPIC ONLY      Result Value Range   WBC, Ur, HPF, POC 10-17     RBC, urine, microscopic TNTC     Bacteria, U Microscopic 4+     Mucus, UA Pos     Epithelial cells, urine per  micros TNTC     Crystals, Ur, HPF, POC neg     Casts, Ur, LPF, POC neg     Yeast, UA neg    POCT URINALYSIS DIPSTICK      Result Value Range   Color, UA Yellow     Clarity, UA cloudy     Glucose, UA neg     Bilirubin, UA neg     Ketones, UA neg     Spec Grav, UA >=1.030     Blood, UA Mod     pH, UA 5.5     Protein, UA Trace     Urobilinogen, UA 0.2     Nitrite, UA neg     Leukocytes, UA small (1+)    POCT WET PREP WITH KOH      Result Value Range   Trichomonas, UA Negative     Clue Cells Wet Prep HPF POC negative     Epithelial Wet Prep HPF POC 1-3     Yeast Wet Prep HPF POC negative     Bacteria Wet Prep HPF POC 1+     RBC Wet Prep HPF POC negative     WBC Wet Prep HPF POC 1-2     KOH Prep POC Positive       EKG/XRAY:   Primary [facility]   ASSESSMENT/PLAN: Encounter Diagnoses  Name Primary?  . Vaginal itching Yes  . Abnormal urine   . Yeast infection    Rx diflucan x 2  She gets it around her menses when she does get menses, will give 1 refill F/u prn Urine cx pending, will not treat since I think it is a contaminant  Gross sideeffects, risk and benefits, and alternatives of medications d/w patient. Patient is aware that all medications have potential sideeffects and we are unable to predict every sideeffect or drug-drug interaction that may occur.  LE, THAO PHUONG, DO 04/04/2013 8:16 AM

## 2013-04-03 LAB — URINE CULTURE
Colony Count: NO GROWTH
Organism ID, Bacteria: NO GROWTH

## 2013-04-23 ENCOUNTER — Ambulatory Visit (INDEPENDENT_AMBULATORY_CARE_PROVIDER_SITE_OTHER): Payer: Self-pay | Admitting: Neurology

## 2013-04-23 DIAGNOSIS — G43909 Migraine, unspecified, not intractable, without status migrainosus: Secondary | ICD-10-CM

## 2013-04-25 ENCOUNTER — Ambulatory Visit (INDEPENDENT_AMBULATORY_CARE_PROVIDER_SITE_OTHER): Payer: BC Managed Care – PPO | Admitting: Neurology

## 2013-04-25 ENCOUNTER — Encounter: Payer: Self-pay | Admitting: Neurology

## 2013-04-25 VITALS — BP 94/63 | HR 78 | Ht 61.0 in | Wt 130.0 lb

## 2013-04-25 DIAGNOSIS — G43909 Migraine, unspecified, not intractable, without status migrainosus: Secondary | ICD-10-CM | POA: Insufficient documentation

## 2013-04-25 MED ORDER — RIZATRIPTAN BENZOATE 5 MG PO TBDP: 5.0000 mg | ORAL_TABLET | ORAL | Status: AC | PRN

## 2013-04-25 NOTE — Progress Notes (Signed)
PATIENT: Krista Kidd DOB: 11-13-1985  HISTORICAL  Krista Kidd is a 28 years old right-handed female, referred by her primary care physician Dr. Normand Sloop for evaluation of headaches  She had a past medical history of headaches since high school, her typical migraine lateralized severe pounding headache was associated light noise sensitivity, she had increased frequency over the past few years, about once a week, trigger for migraine menstruation, stress, sleep deprivation, it can last hours to 3 days,  She used to take ibuprofen 800 mg as needed, which has been helpful, sleep always helps, She has tried over-the-counter Aleve, Tylenol, Excedrin Migraine, without much help, she never tried to 10 treatment in the past, she has never tried preventive medication past either.     REVIEW OF SYSTEMS: Full 14 system review of systems performed and notable only for constipation, easy bruising, ALLERGIES: Allergies  Allergen Reactions  . Penicillins Hives    HOME MEDICATIONS: Current Outpatient Prescriptions on File Prior to Visit  Medication Sig Dispense Refill  . fluconazole (DIFLUCAN) 150 MG tablet Take 1 tablet (150 mg total) by mouth once. May repeat x 1  2 tablet  1  . metroNIDAZOLE (FLAGYL) 500 MG tablet Take 1 tablet (500 mg total) by mouth 2 (two) times daily with a meal. DO NOT CONSUME ALCOHOL WHILE TAKING THIS MEDICATION.  14 tablet  0  . Norethindrone-Ethinyl Estradiol-Fe Biphas (LO LOESTRIN FE) 1 MG-10 MCG / 10 MCG tablet Take 1 tablet by mouth daily.  1 Package  3   No current facility-administered medications on file prior to visit.    PAST MEDICAL HISTORY: Past Medical History  Diagnosis Date  . Abnormal Pap smear 08/2009  . Chlamydia   . Gonorrhea   . HPV in female   . Headache, migraine   . GERD (gastroesophageal reflux disease)   . Chlamydia   . Allergy     PAST SURGICAL HISTORY: Past Surgical History  Procedure Laterality Date  . Colposcopy       FAMILY HISTORY: Family History  Problem Relation Age of Onset  . Hypertension Paternal Grandmother   . Diabetes Paternal Grandmother   . Hypertension Maternal Grandmother   . Diabetes Maternal Grandmother   . Cancer Maternal Grandmother   . Hypertension Maternal Grandfather   . Diabetes Maternal Grandfather   . Hypertension Mother     SOCIAL HISTORY:  History   Social History  . Marital Status: Married    Spouse Name: N/A    Number of Children: 0  . Years of Education: college   Occupational History  . Not on file.   Social History Main Topics  . Smoking status: Never Smoker   . Smokeless tobacco: Never Used  . Alcohol Use: Yes     Comment: occasional  . Drug Use: No  . Sexual Activity: Yes    Birth Control/ Protection: None, Condom, Pill   Other Topics Concern  . Not on file   Social History Narrative   Marital status:  Single; dating casually. Patient lives at home with grandfather. College education.      Children none      Employment:  Unemployed.      Tobacco; none       Alcohol:  Twice weekly      Drugs: none     PHYSICAL EXAM   Filed Vitals:   04/25/13 0815  BP: 94/63  Pulse: 78  Height: 5\' 1"  (1.549 m)  Weight: 130 lb (58.968 kg)  Not recorded    Body mass index is 24.58 kg/(m^2).   Generalized: In no acute distress  Neck: Supple, no carotid bruits   Cardiac: Regular rate rhythm  Pulmonary: Clear to auscultation bilaterally  Musculoskeletal: No deformity  Neurological examination  Mentation: Alert oriented to time, place, history taking, and causual conversation  Cranial nerve II-XII: Pupils were equal round reactive to light. Extraocular movements were full.  Visual field were full on confrontational test. Bilateral fundi were sharp.  Facial sensation and strength were normal. Hearing was intact to finger rubbing bilaterally. Uvula tongue midline.  Head turning and shoulder shrug and were normal and symmetric.Tongue  protrusion into cheek strength was normal.  Motor: Normal tone, bulk and strength.  Sensory: Intact to fine touch, pinprick, preserved vibratory sensation, and proprioception at toes.  Coordination: Normal finger to nose, heel-to-shin bilaterally there was no truncal ataxia  Gait: Rising up from seated position without assistance, normal stance, without trunk ataxia, moderate stride, good arm swing, smooth turning, able to perform tiptoe, and heel walking without difficulty.   Romberg signs: Negative  Deep tendon reflexes: Brachioradialis 2/2, biceps 2/2, triceps 2/2, patellar 2/2, Achilles 2/2, plantar responses were flexor bilaterally.   DIAGNOSTIC DATA (LABS, IMAGING, TESTING) - I reviewed patient records, labs, notes, testing and imaging myself where available.  Lab Results  Component Value Date   WBC 7.2 10/18/2012   HGB 12.8 10/18/2012   HCT 39.8 10/18/2012   MCV 98.1* 10/18/2012      Component Value Date/Time   NA 138 10/18/2012 1913   K 4.2 10/18/2012 1913   CL 106 10/18/2012 1913   CO2 23 10/18/2012 1913   GLUCOSE 80 10/18/2012 1913   BUN 10 10/18/2012 1913   CREATININE 0.69 10/18/2012 1913   CALCIUM 9.2 10/18/2012 1913   PROT 7.2 10/18/2012 1913   ALBUMIN 4.1 10/18/2012 1913   AST 14 10/18/2012 1913   ALT 10 10/18/2012 1913   ALKPHOS 50 10/18/2012 1913   BILITOT 0.7 10/18/2012 1913    Lab Results  Component Value Date   TSH 1.763 01/12/2012      ASSESSMENT AND PLAN  Leonia Coronashley T Arizmendi is a 28 y.o. female was migraine headaches,  1  Maxalt as needed for abortive treatment 2 return to clinic with Eber Jonesarolyn in 6 months.     Levert FeinsteinYijun Litzy Dicker, M.D. Ph.D.  [facility] [address], [address] [place], [place] [address] [phone]

## 2013-04-30 NOTE — Progress Notes (Signed)
error 

## 2013-05-02 ENCOUNTER — Emergency Department (HOSPITAL_COMMUNITY): Payer: BC Managed Care – PPO

## 2013-05-02 ENCOUNTER — Encounter (HOSPITAL_COMMUNITY): Payer: Self-pay | Admitting: Emergency Medicine

## 2013-05-02 ENCOUNTER — Emergency Department (HOSPITAL_COMMUNITY)
Admission: EM | Admit: 2013-05-02 | Discharge: 2013-05-03 | Disposition: A | Discharge status: Home / Self Care | Payer: BC Managed Care – PPO | Attending: Emergency Medicine | Admitting: Emergency Medicine

## 2013-05-02 DIAGNOSIS — K219 Gastro-esophageal reflux disease without esophagitis: Secondary | ICD-10-CM | POA: Insufficient documentation

## 2013-05-02 DIAGNOSIS — R0789 Other chest pain: Secondary | ICD-10-CM | POA: Insufficient documentation

## 2013-05-02 DIAGNOSIS — Z711 Person with feared health complaint in whom no diagnosis is made: Secondary | ICD-10-CM

## 2013-05-02 DIAGNOSIS — Z792 Long term (current) use of antibiotics: Secondary | ICD-10-CM | POA: Insufficient documentation

## 2013-05-02 DIAGNOSIS — R079 Chest pain, unspecified: Secondary | ICD-10-CM

## 2013-05-02 DIAGNOSIS — B373 Candidiasis of vulva and vagina: Secondary | ICD-10-CM | POA: Insufficient documentation

## 2013-05-02 DIAGNOSIS — G43909 Migraine, unspecified, not intractable, without status migrainosus: Secondary | ICD-10-CM | POA: Insufficient documentation

## 2013-05-02 DIAGNOSIS — Z79899 Other long term (current) drug therapy: Secondary | ICD-10-CM | POA: Insufficient documentation

## 2013-05-02 DIAGNOSIS — R110 Nausea: Secondary | ICD-10-CM | POA: Insufficient documentation

## 2013-05-02 DIAGNOSIS — R0602 Shortness of breath: Secondary | ICD-10-CM | POA: Insufficient documentation

## 2013-05-02 DIAGNOSIS — M129 Arthropathy, unspecified: Secondary | ICD-10-CM | POA: Insufficient documentation

## 2013-05-02 DIAGNOSIS — Z880 Allergy status to penicillin: Secondary | ICD-10-CM | POA: Insufficient documentation

## 2013-05-02 DIAGNOSIS — B3731 Acute candidiasis of vulva and vagina: Secondary | ICD-10-CM | POA: Insufficient documentation

## 2013-05-02 DIAGNOSIS — N390 Urinary tract infection, site not specified: Secondary | ICD-10-CM | POA: Insufficient documentation

## 2013-05-02 DIAGNOSIS — Z3202 Encounter for pregnancy test, result negative: Secondary | ICD-10-CM | POA: Insufficient documentation

## 2013-05-02 LAB — URINALYSIS, ROUTINE W REFLEX MICROSCOPIC
Bilirubin Urine: NEGATIVE
GLUCOSE, UA: NEGATIVE mg/dL
Ketones, ur: 15 mg/dL — AB
Nitrite: NEGATIVE
Protein, ur: NEGATIVE mg/dL
Specific Gravity, Urine: 1.023 (ref 1.005–1.030)
Urobilinogen, UA: 1 mg/dL (ref 0.0–1.0)
pH: 6.5 (ref 5.0–8.0)

## 2013-05-02 LAB — BASIC METABOLIC PANEL
BUN: 9 mg/dL (ref 6–23)
CALCIUM: 9.7 mg/dL (ref 8.4–10.5)
CO2: 25 meq/L (ref 19–32)
CREATININE: 0.67 mg/dL (ref 0.50–1.10)
Chloride: 99 mEq/L (ref 96–112)
GFR calc Af Amer: >90 mL/min (ref >90)
GFR calc non Af Amer: >90 mL/min (ref >90)
Glucose, Bld: 127 mg/dL — ABNORMAL HIGH (ref 70–99)
Potassium: 3.5 mEq/L — ABNORMAL LOW (ref 3.7–5.3)
Sodium: 139 mEq/L (ref 137–147)

## 2013-05-02 LAB — POC URINE PREG, ED: PREG TEST UR: NEGATIVE

## 2013-05-02 LAB — I-STAT TROPONIN, ED: Troponin i, poc: 0 ng/mL (ref 0.00–0.08)

## 2013-05-02 LAB — CBC
HEMATOCRIT: 37.3 % (ref 36.0–46.0)
Hemoglobin: 13.1 g/dL (ref 12.0–15.0)
MCH: 33 pg (ref 26.0–34.0)
MCHC: 35.1 g/dL (ref 30.0–36.0)
MCV: 94 fL (ref 78.0–100.0)
PLATELETS: 256 10*3/uL (ref 150–400)
RBC: 3.97 MIL/uL (ref 3.87–5.11)
RDW: 12.1 % (ref 11.5–15.5)
WBC: 13.1 10*3/uL — AB (ref 4.0–10.5)

## 2013-05-02 LAB — URINE MICROSCOPIC-ADD ON

## 2013-05-02 LAB — WET PREP, GENITAL: TRICH WET PREP: NONE SEEN

## 2013-05-02 LAB — PRO B NATRIURETIC PEPTIDE: Pro B Natriuretic peptide (BNP): 11.7 pg/mL (ref 0–125)

## 2013-05-02 MED ORDER — CEFTRIAXONE SODIUM 1 G IJ SOLR
1.0000 g | Freq: Once | INTRAMUSCULAR | Status: AC
  Administered 2013-05-02: 1 g via INTRAMUSCULAR
  Filled 2013-05-02: qty 10

## 2013-05-02 MED ORDER — METRONIDAZOLE 500 MG PO TABS: 500.0000 mg | ORAL_TABLET | Freq: Two times a day (BID) | ORAL | Status: DC

## 2013-05-02 MED ORDER — STERILE WATER FOR INJECTION IJ SOLN
INTRAMUSCULAR | Status: AC
  Administered 2013-05-02: 2.1 mL
  Filled 2013-05-02: qty 10

## 2013-05-02 MED ORDER — CEPHALEXIN 500 MG PO CAPS: 500.0000 mg | ORAL_CAPSULE | Freq: Four times a day (QID) | ORAL | Status: DC

## 2013-05-02 MED ORDER — GI COCKTAIL ~~LOC~~
30.0000 mL | Freq: Once | ORAL | Status: AC
  Administered 2013-05-02: 30 mL via ORAL
  Filled 2013-05-02: qty 30

## 2013-05-02 MED ORDER — PANTOPRAZOLE SODIUM 40 MG PO TBEC
40.0000 mg | DELAYED_RELEASE_TABLET | Freq: Once | ORAL | Status: AC
  Administered 2013-05-02: 40 mg via ORAL
  Filled 2013-05-02: qty 1

## 2013-05-02 MED ORDER — AZITHROMYCIN 250 MG PO TABS
1000.0000 mg | ORAL_TABLET | Freq: Once | ORAL | Status: AC
  Administered 2013-05-02: 1000 mg via ORAL
  Filled 2013-05-02: qty 4

## 2013-05-02 MED ORDER — OMEPRAZOLE 20 MG PO CPDR: 20.0000 mg | DELAYED_RELEASE_CAPSULE | Freq: Every day | ORAL | Status: AC

## 2013-05-02 MED ORDER — FLUCONAZOLE 150 MG PO TABS: 150.0000 mg | ORAL_TABLET | Freq: Once | ORAL | Status: DC

## 2013-05-02 MED ORDER — IBUPROFEN 200 MG PO TABS
400.0000 mg | ORAL_TABLET | Freq: Once | ORAL | Status: AC
  Administered 2013-05-02: 400 mg via ORAL
  Filled 2013-05-02: qty 2

## 2013-05-02 NOTE — ED Notes (Signed)
Pt sitting in site of nurse first with mother. Pt reports no decrease in pain. Apologized for delay.

## 2013-05-02 NOTE — ED Notes (Signed)
Pt reports epigastric pain since 5pm. States pain started after she ate dinner. Pt reports back pain, too. Pt reports acid reflux and that she has had chest burning and back pain with the acid reflux. Reports she thinks she has a "bacterial infection in her vagina" and wants a pelvic exam performed.

## 2013-05-02 NOTE — ED Provider Notes (Signed)
CSN: [id number]     Arrival date & time 05/02/13  1916 History   First MD Initiated Contact with Patient 05/02/13 2135     Chief Complaint  Patient presents with  . Chest Pain     (Consider location/radiation/quality/duration/timing/severity/associated sxs/prior Treatment) Patient is a 28 y.o. female presenting with chest pain. The history is provided by the patient and medical records. No language interpreter was used.  Chest Pain Associated symptoms: nausea and shortness of breath   Associated symptoms: no abdominal pain, no back pain, no cough, no diaphoresis, no fatigue, no fever, no headache and not vomiting     Krista Kidd is a 28 y.o. female  with a hx of GERD, migraine headache presents to the Emergency Department complaining of gradual, persistent, progressively worsening substernal chest pain with associated epigastric discomfort and thoracic back pain onset 5 PM today after eating a bacon cheeseburger and a large fries.  She also reports slight shortness of breath and nausea with her symptoms. She reports she took TUMS at home with mild relief.  Patient reports a history of reflux to me but states she is not taking any medications for it. She also reports 2 days of malodorous vaginal discharge. She reports she took one fluconazole tablet last night in the itching improved but she's continued to have thick, white vaginal discharge. She reports a 1 female sexual partner in the last 6 months. She also reports a history of gonorrhea and Chlamydia.  She denies abdominal pain, vomiting, diarrhea, fever, chills, headache, neck pain, weakness, dizziness, syncope.  She also endorses associated dysuria.  Past Medical History  Diagnosis Date  . Abnormal Pap smear 08/2009  . Chlamydia   . Gonorrhea   . HPV in female   . Headache, migraine   . GERD (gastroesophageal reflux disease)   . Chlamydia   . Allergy    Past Surgical History  Procedure Laterality Date  . Colposcopy     Family  History  Problem Relation Age of Onset  . Hypertension Paternal Grandmother   . Diabetes Paternal Grandmother   . Hypertension Maternal Grandmother   . Diabetes Maternal Grandmother   . Cancer Maternal Grandmother   . Hypertension Maternal Grandfather   . Diabetes Maternal Grandfather   . Hypertension Mother    History  Substance Use Topics  . Smoking status: Never Smoker   . Smokeless tobacco: Never Used  . Alcohol Use: Yes     Comment: occasional   OB History   Grav Para Term Preterm Abortions TAB SAB Ect Mult Living   0 0 0 0 0 0 0 0 0 0      Review of Systems  Constitutional: Negative for fever, diaphoresis, appetite change, fatigue and unexpected weight change.  HENT: Negative for mouth sores.   Eyes: Negative for visual disturbance.  Respiratory: Positive for shortness of breath. Negative for cough, chest tightness and wheezing.   Cardiovascular: Positive for chest pain.  Gastrointestinal: Positive for nausea. Negative for vomiting, abdominal pain, diarrhea and constipation.  Endocrine: Negative for polydipsia, polyphagia and polyuria.  Genitourinary: Positive for dysuria and vaginal discharge. Negative for urgency, frequency and hematuria.  Musculoskeletal: Negative for back pain and neck stiffness.  Skin: Negative for rash.  Allergic/Immunologic: Negative for immunocompromised state.  Neurological: Negative for syncope, light-headedness and headaches.  Hematological: Does not bruise/bleed easily.  Psychiatric/Behavioral: Negative for sleep disturbance. The patient is not nervous/anxious.       Allergies  Penicillins  Home Medications  Current Outpatient Rx  Name  Route  Sig  Dispense  Refill  . fluconazole (DIFLUCAN) 150 MG tablet   Oral   Take 1 tablet (150 mg total) by mouth once. May repeat x 1   2 tablet   1   . Norethindrone-Ethinyl Estradiol-Fe Biphas (LO LOESTRIN FE) 1 MG-10 MCG / 10 MCG tablet   Oral   Take 1 tablet by mouth daily.   1  Package   3   . rizatriptan (MAXALT-MLT) 5 MG disintegrating tablet   Oral   Take 1 tablet (5 mg total) by mouth as needed for migraine. May repeat in 2 hours if needed   15 tablet   12   . cephALEXin (KEFLEX) 500 MG capsule   Oral   Take 1 capsule (500 mg total) by mouth 4 (four) times daily.   40 capsule   0   . fluconazole (DIFLUCAN) 150 MG tablet   Oral   Take 1 tablet (150 mg total) by mouth once.   1 tablet   0   . metroNIDAZOLE (FLAGYL) 500 MG tablet   Oral   Take 1 tablet (500 mg total) by mouth 2 (two) times daily. One po bid x 7 days   14 tablet   0   . omeprazole (PRILOSEC) 20 MG capsule   Oral   Take 1 capsule (20 mg total) by mouth daily.   30 capsule   0    BP 108/61  Pulse 77  Temp(Src) 99.1 F (37.3 C) (Oral)  Resp 20  SpO2 100%  LMP 04/24/2013 Physical Exam  Nursing note and vitals reviewed. Constitutional: She appears well-developed and well-nourished. No distress.  Awake, alert, nontoxic appearance  HENT:  Head: Normocephalic and atraumatic.  Mouth/Throat: Oropharynx is clear and moist. No oropharyngeal exudate.  Eyes: Conjunctivae are normal. No scleral icterus.  Neck: Normal range of motion. Neck supple.  Full ROM without pain  Cardiovascular: Normal rate, regular rhythm, normal heart sounds and intact distal pulses.   No murmur heard. No tachycardia  Pulmonary/Chest: Effort normal and breath sounds normal. No respiratory distress. She has no decreased breath sounds. She has no wheezes. She has no rhonchi. She has no rales. She exhibits tenderness.    Clear and equal breath sounds Very mild tenderness to palpation of the anterior chest  Abdominal: Soft. Normal appearance and bowel sounds are normal. She exhibits no distension and no mass. There is no hepatosplenomegaly. There is tenderness in the epigastric area. There is no rebound, no guarding and no CVA tenderness.    Endorses mild discomfort with palpation to the epigastrium but  no sharp pain, no guarding, rigidity or CVA tenderness no peritoneal signs  Genitourinary: Uterus normal. Pelvic exam was performed with patient supine. There is no rash, tenderness, lesion or injury on the right labia. There is no rash, tenderness, lesion or injury on the left labia. Uterus is not deviated, not enlarged, not fixed and not tender. Cervix exhibits no motion tenderness, no discharge and no friability. Right adnexum displays no mass, no tenderness and no fullness. Left adnexum displays no mass, no tenderness and no fullness. No erythema, tenderness or bleeding around the vagina. No foreign body around the vagina. No signs of injury around the vagina. Vaginal discharge (thick, white, large amount) found.  Musculoskeletal: Normal range of motion. She exhibits no edema.       Cervical back: Normal.       Thoracic back: She exhibits tenderness (mild) and pain.  She exhibits no bony tenderness, no swelling, no edema, no deformity, no laceration and no spasm.       Lumbar back: Normal.  Full range of motion of the T-spine and L-spine No tenderness to palpation of the spinous processes of the T-spine or L-spine Mild tenderness to palpation of the bilateral paraspinous muscles of the T-spine  Lymphadenopathy:    She has no cervical adenopathy.  Neurological: She is alert. She has normal reflexes.  Speech is clear and goal oriented, follows commands Normal strength in upper and lower extremities bilaterally including dorsiflexion and plantar flexion, strong and equal grip strength Sensation normal to light and sharp touch Moves extremities without ataxia, coordination intact Normal gait Normal balance   Skin: Skin is warm and dry. No rash noted. She is not diaphoretic. No erythema.  Psychiatric: She has a normal mood and affect. Her behavior is normal.    ED Course  Procedures (including critical care time) Labs Review Labs Reviewed  WET PREP, GENITAL - Abnormal; Notable for the  following:    Yeast Wet Prep HPF POC FEW (*)    Clue Cells Wet Prep HPF POC TOO NUMEROUS TO COUNT (*)    WBC, Wet Prep HPF POC MODERATE (*)    All other components within normal limits  CBC - Abnormal; Notable for the following:    WBC 13.1 (*)    All other components within normal limits  BASIC METABOLIC PANEL - Abnormal; Notable for the following:    Potassium 3.5 (*)    Glucose, Bld 127 (*)    All other components within normal limits  URINALYSIS, ROUTINE W REFLEX MICROSCOPIC - Abnormal; Notable for the following:    Color, Urine AMBER (*)    APPearance CLOUDY (*)    Hgb urine dipstick MODERATE (*)    Ketones, ur 15 (*)    Leukocytes, UA LARGE (*)    All other components within normal limits  URINE MICROSCOPIC-ADD ON - Abnormal; Notable for the following:    Squamous Epithelial / LPF MANY (*)    Bacteria, UA MANY (*)    All other components within normal limits  GC/CHLAMYDIA PROBE AMP  PRO B NATRIURETIC PEPTIDE  I-STAT TROPOININ, ED  POC URINE PREG, ED   Imaging Review Dg Chest 2 View  05/02/2013   CLINICAL DATA:  Chest pain radiating to the back.  EXAM: CHEST  2 VIEW  COMPARISON:  CT ABD/PELVIS W CM dated 12/01/2012; DG CHEST 2 VIEW dated 11/14/2011  FINDINGS: The heart size and mediastinal contours are within normal limits. Both lungs are clear. The visualized skeletal structures are unremarkable.  IMPRESSION: No active cardiopulmonary disease.   Electronically Signed   By: Andreas Newport M.D.   On: 05/02/2013 21:12     EKG Interpretation None      MDM   Final diagnoses:  Vaginal candida  UTI (lower urinary tract infection)  Concern about STD in female without diagnosis  Chest pain  GERD (gastroesophageal reflux disease)   Krista Kidd presents with substernal chest pain and thoracic back pain today after eating a cheeseburger.  Patient with history of GERD for which she does not take any medications. Troponin negative, ECG nonischemic. Patient without personal or  family cardiac history.  BNP normal, CBC with mild leukocytosis but otherwise reassuring. BMP reassuring as well. Chest x-ray without evidence of pneumonia or pulmonary edema.  I personally reviewed the imaging tests through PACS system  I reviewed available ER/hospitalization records through the  EMR  Urinalysis and wet prep pending.  The patient's chest pain likely secondary to acid reflux. We'll give GI cocktail and Protonix and reassess.  Highly doubt ACS at this time.  11:15 PM Patient with improving symptoms after Protonix and GI cocktail. She reports her chest pain, epigastric pain and back pain are resolving. Wet prep pending.  11:31 PM Wet prep with yeast, clue cells and moderate white blood cells. Patient expressing concern about possible STD. We'll treat prophylactically here in the emergency department due to increased white blood cells.  Urinalysis also with possible urinary tract infection. Will D/C home with keflex.  Patient wet prep also with yeast and pelvic exam consistent with same. Will give Diflucan for repeat dose at 72 hours.  D-dimer negative.  Pt low risk, but is taking OCP.  She was initially tachycardic on triage, but no tachycardia on my exam.    Patient's chest pain resolved after giving by mouth medications. Home discharge home with omeprazole.  Discussed the need for further evaluation with primary care physician and OB/GYN for further evaluation. Also discussed reasons to return here to the emergency department.  It has been determined that no acute conditions requiring further emergency intervention are present at this time. The patient/guardian have been advised of the diagnosis and plan. We have discussed signs and symptoms that warrant return to the ED, such as changes or worsening in symptoms.   Vital signs are stable at discharge.   BP 108/61  Pulse 77  Temp(Src) 99.1 F (37.3 C) (Oral)  Resp 20  SpO2 100%  LMP 04/24/2013  Patient/guardian has voiced  understanding and agreed to follow-up with the PCP or specialist.      Dierdre ForthHannah Fedra Lanter, PA-C 05/03/13 [id number]

## 2013-05-02 NOTE — Discharge Instructions (Signed)
1. Medications: flagyl, keflex, diflucan, omeprazole, usual home medications 2. Treatment: rest, drink plenty of fluids,  3. Follow Up: Please followup with your primary doctor in and your OB/GYN for discussion of your diagnoses and further evaluation after today's visit; if you do not have a primary care doctor use the resource guide provided to find one;    Gastroesophageal Reflux Disease, Adult Gastroesophageal reflux disease (GERD) happens when acid from your stomach flows up into the esophagus. When acid comes in contact with the esophagus, the acid causes soreness (inflammation) in the esophagus. Over time, GERD may create small holes (ulcers) in the lining of the esophagus. CAUSES   Increased body weight. This puts pressure on the stomach, making acid rise from the stomach into the esophagus.  Smoking. This increases acid production in the stomach.  Drinking alcohol. This causes decreased pressure in the lower esophageal sphincter (valve or ring of muscle between the esophagus and stomach), allowing acid from the stomach into the esophagus.  Late evening meals and a full stomach. This increases pressure and acid production in the stomach.  A malformed lower esophageal sphincter. Sometimes, no cause is found. SYMPTOMS   Burning pain in the lower part of the mid-chest behind the breastbone and in the mid-stomach area. This may occur twice a week or more often.  Trouble swallowing.  Sore throat.  Dry cough.  Asthma-like symptoms including chest tightness, shortness of breath, or wheezing. DIAGNOSIS  Your caregiver may be able to diagnose GERD based on your symptoms. In some cases, X-rays and other tests may be done to check for complications or to check the condition of your stomach and esophagus. TREATMENT  Your caregiver may recommend over-the-counter or prescription medicines to help decrease acid production. Ask your caregiver before starting or adding any new medicines.    HOME CARE INSTRUCTIONS   Change the factors that you can control. Ask your caregiver for guidance concerning weight loss, quitting smoking, and alcohol consumption.  Avoid foods and drinks that make your symptoms worse, such as:  Caffeine or alcoholic drinks.  Chocolate.  Peppermint or mint flavorings.  Garlic and onions.  Spicy foods.  Citrus fruits, such as oranges, lemons, or limes.  Tomato-based foods such as sauce, chili, salsa, and pizza.  Fried and fatty foods.  Avoid lying down for the 3 hours prior to your bedtime or prior to taking a nap.  Eat small, frequent meals instead of large meals.  Wear loose-fitting clothing. Do not wear anything tight around your waist that causes pressure on your stomach.  Raise the head of your bed 6 to 8 inches with wood blocks to help you sleep. Extra pillows will not help.  Only take over-the-counter or prescription medicines for pain, discomfort, or fever as directed by your caregiver.  Do not take aspirin, ibuprofen, or other nonsteroidal anti-inflammatory drugs (NSAIDs). SEEK IMMEDIATE MEDICAL CARE IF:   You have pain in your arms, neck, jaw, teeth, or back.  Your pain increases or changes in intensity or duration.  You develop nausea, vomiting, or sweating (diaphoresis).  You develop shortness of breath, or you faint.  Your vomit is green, yellow, black, or looks like coffee grounds or blood.  Your stool is red, bloody, or black. These symptoms could be signs of other problems, such as heart disease, gastric bleeding, or esophageal bleeding. MAKE SURE YOU:   Understand these instructions.  Will watch your condition.  Will get help right away if you are not doing well or  get worse. Document Released: 11/25/2004 Document Revised: 05/10/2011 Document Reviewed: 09/04/2010 [facility] Patient Information 2014 [place], [place].  Bacterial Vaginosis Bacterial vaginosis is a vaginal infection that occurs when the normal  balance of bacteria in the vagina is disrupted. It results from an overgrowth of certain bacteria. This is the most common vaginal infection in women of childbearing age. Treatment is important to prevent complications, especially in pregnant women, as it can cause a premature delivery. CAUSES  Bacterial vaginosis is caused by an increase in harmful bacteria that are normally present in smaller amounts in the vagina. Several different kinds of bacteria can cause bacterial vaginosis. However, the reason that the condition develops is not fully understood. RISK FACTORS Certain activities or behaviors can put you at an increased risk of developing bacterial vaginosis, including:  Having a new sex partner or multiple sex partners.  Douching.  Using an intrauterine device (IUD) for contraception. Women do not get bacterial vaginosis from toilet seats, bedding, swimming pools, or contact with objects around them. SIGNS AND SYMPTOMS  Some women with bacterial vaginosis have no signs or symptoms. Common symptoms include:  Grey vaginal discharge.  A fishlike odor with discharge, especially after sexual intercourse.  Itching or burning of the vagina and vulva.  Burning or pain with urination. DIAGNOSIS  Your health care provider will take a medical history and examine the vagina for signs of bacterial vaginosis. A sample of vaginal fluid may be taken. Your health care provider will look at this sample under a microscope to check for bacteria and abnormal cells. A vaginal pH test may also be done.  TREATMENT  Bacterial vaginosis may be treated with antibiotic medicines. These may be given in the form of a pill or a vaginal cream. A second round of antibiotics may be prescribed if the condition comes back after treatment.  HOME CARE INSTRUCTIONS   Only take over-the-counter or prescription medicines as directed by your health care provider.  If antibiotic medicine was prescribed, take it as  directed. Make sure you finish it even if you start to feel better.  Do not have sex until treatment is completed.  Tell all sexual partners that you have a vaginal infection. They should see their health care provider and be treated if they have problems, such as a mild rash or itching.  Practice safe sex by using condoms and only having one sex partner. SEEK MEDICAL CARE IF:   Your symptoms are not improving after 3 days of treatment.  You have increased discharge or pain.  You have a fever. MAKE SURE YOU:   Understand these instructions.  Will watch your condition.  Will get help right away if you are not doing well or get worse. FOR MORE INFORMATION  Centers for Disease Control and Prevention, Division of STD Prevention: [employer]Apps[employer] American Sexual Health Association (ASHA): www.ashastd.org  Document Released: 02/15/2005 Document Revised: 12/06/2012 Document Reviewed: 09/27/2012 [facility] Patient Information 2014 [place], [place].    Vaginitis Vaginitis is an inflammation of the vagina. It is most often caused by a change in the normal balance of the bacteria and yeast that live in the vagina. This change in balance causes an overgrowth of certain bacteria or yeast, which causes the inflammation. There are different types of vaginitis, but the most common types are:  Bacterial vaginosis.  Yeast infection (candidiasis).  Trichomoniasis vaginitis. This is a sexually transmitted infection (STI).  Viral vaginitis.  Atropic vaginitis.  Allergic vaginitis. CAUSES  The cause depends on the type  of vaginitis. Vaginitis can be caused by:  Bacteria (bacterial vaginosis).  Yeast (yeast infection).  A parasite (trichomoniasis vaginitis)  A virus (viral vaginitis).  Low hormone levels (atrophic vaginitis). Low hormone levels can occur during pregnancy, breastfeeding, or after menopause.  Irritants, such as bubble baths, scented tampons, and feminine sprays (allergic  vaginitis). Other factors can change the normal balance of the yeast and bacteria that live in the vagina. These include:  Antibiotic medicines.  Poor hygiene.  Diaphragms, vaginal sponges, spermicides, birth control pills, and intrauterine devices (IUD).  Sexual intercourse.  Infection.  Uncontrolled diabetes.  A weakened immune system. SYMPTOMS  Symptoms can vary depending on the cause of the vaginitis. Common symptoms include:  Abnormal vaginal discharge.  The discharge is white, gray, or yellow with bacterial vaginosis.  The discharge is thick, white, and cheesy with a yeast infection.  The discharge is frothy and yellow or greenish with trichomoniasis.  A bad vaginal odor.  The odor is fishy with bacterial vaginosis.  Vaginal itching, pain, or swelling.  Painful intercourse.  Pain or burning when urinating. Sometimes, there are no symptoms. TREATMENT  Treatment will vary depending on the type of infection.   Bacterial vaginosis and trichomoniasis are often treated with antibiotic creams or pills.  Yeast infections are often treated with antifungal medicines, such as vaginal creams or suppositories.  Viral vaginitis has no cure, but symptoms can be treated with medicines that relieve discomfort. Your sexual partner should be treated as well.  Atrophic vaginitis may be treated with an estrogen cream, pill, suppository, or vaginal ring. If vaginal dryness occurs, lubricants and moisturizing creams may help. You may be told to avoid scented soaps, sprays, or douches.  Allergic vaginitis treatment involves quitting the use of the product that is causing the problem. Vaginal creams can be used to treat the symptoms. HOME CARE INSTRUCTIONS   Take all medicines as directed by your caregiver.  Keep your genital area clean and dry. Avoid soap and only rinse the area with water.  Avoid douching. It can remove the healthy bacteria in the vagina.  Do not use tampons  or have sexual intercourse until your vaginitis has been treated. Use sanitary pads while you have vaginitis.  Wipe from front to back. This avoids the spread of bacteria from the rectum to the vagina.  Let air reach your genital area.  Wear cotton underwear to decrease moisture buildup.  Avoid wearing underwear while you sleep until your vaginitis is gone.  Avoid tight pants and underwear or nylons without a cotton panel.  Take off wet clothing (especially bathing suits) as soon as possible.  Use mild, non-scented products. Avoid using irritants, such as:  Scented feminine sprays.  Fabric softeners.  Scented detergents.  Scented tampons.  Scented soaps or bubble baths.  Practice safe sex and use condoms. Condoms may prevent the spread of trichomoniasis and viral vaginitis. SEEK MEDICAL CARE IF:   You have abdominal pain.  You have a fever or persistent symptoms for more than 2 3 days.  You have a fever and your symptoms suddenly get worse. Document Released: 12/13/2006 Document Revised: 11/10/2011 Document Reviewed: 07/29/2011 [facility] Patient Information 2014 [place], [place].

## 2013-05-02 NOTE — ED Notes (Signed)
Pt. reports mid chest pain onset 5 pm today with slight SOB and nausea. Pain is non radiating / denies diaphoresis , no cough or congestion .

## 2013-05-03 ENCOUNTER — Encounter: Payer: Self-pay | Admitting: Family Medicine

## 2013-05-03 LAB — GC/CHLAMYDIA PROBE AMP
CT Probe RNA: NEGATIVE
GC Probe RNA: NEGATIVE

## 2013-05-03 LAB — D-DIMER, QUANTITATIVE (NOT AT ARMC)

## 2013-05-03 NOTE — ED Provider Notes (Signed)
Medical screening examination/treatment/procedure(s) were performed by non-physician practitioner and as supervising physician I was immediately available for consultation/collaboration.   EKG Interpretation None       Glynn OctaveStephen Cohl Behrens, MD 05/03/13 [phone]

## 2013-05-05 LAB — URINE CULTURE

## 2013-06-20 ENCOUNTER — Inpatient Hospital Stay (HOSPITAL_COMMUNITY)
Admission: AD | Admit: 2013-06-20 | Discharge: 2013-06-20 | Disposition: A | Discharge status: Home / Self Care | Payer: BC Managed Care – PPO | Source: Ambulatory Visit | Attending: Obstetrics and Gynecology | Admitting: Obstetrics and Gynecology

## 2013-06-20 ENCOUNTER — Encounter (HOSPITAL_COMMUNITY): Payer: Self-pay | Admitting: *Deleted

## 2013-06-20 DIAGNOSIS — R109 Unspecified abdominal pain: Secondary | ICD-10-CM | POA: Insufficient documentation

## 2013-06-20 DIAGNOSIS — Z202 Contact with and (suspected) exposure to infections with a predominantly sexual mode of transmission: Secondary | ICD-10-CM

## 2013-06-20 DIAGNOSIS — M62838 Other muscle spasm: Secondary | ICD-10-CM

## 2013-06-20 LAB — CBC
HCT: 38.9 % (ref 36.0–46.0)
HEMOGLOBIN: 13 g/dL (ref 12.0–15.0)
MCH: 32 pg (ref 26.0–34.0)
MCHC: 33.4 g/dL (ref 30.0–36.0)
MCV: 95.8 fL (ref 78.0–100.0)
PLATELETS: 273 10*3/uL (ref 150–400)
RBC: 4.06 MIL/uL (ref 3.87–5.11)
RDW: 12 % (ref 11.5–15.5)
WBC: 9.7 10*3/uL (ref 4.0–10.5)

## 2013-06-20 LAB — URINALYSIS, ROUTINE W REFLEX MICROSCOPIC
Bilirubin Urine: NEGATIVE
Glucose, UA: NEGATIVE mg/dL
Ketones, ur: NEGATIVE mg/dL
LEUKOCYTES UA: NEGATIVE
Nitrite: NEGATIVE
PH: 6 (ref 5.0–8.0)
Protein, ur: NEGATIVE mg/dL
Specific Gravity, Urine: >1.03 — ABNORMAL HIGH (ref 1.005–1.030)
Urobilinogen, UA: 0.2 mg/dL (ref 0.0–1.0)

## 2013-06-20 LAB — POCT PREGNANCY, URINE: Preg Test, Ur: NEGATIVE

## 2013-06-20 LAB — URINE MICROSCOPIC-ADD ON

## 2013-06-20 LAB — COMPREHENSIVE METABOLIC PANEL
ALT: 18 U/L (ref 0–35)
AST: 19 U/L (ref 0–37)
Albumin: 3.7 g/dL (ref 3.5–5.2)
Alkaline Phosphatase: 55 U/L (ref 39–117)
BUN: 9 mg/dL (ref 6–23)
CALCIUM: 9.2 mg/dL (ref 8.4–10.5)
CO2: 27 meq/L (ref 19–32)
Chloride: 106 mEq/L (ref 96–112)
Creatinine, Ser: 0.65 mg/dL (ref 0.50–1.10)
GLUCOSE: 95 mg/dL (ref 70–99)
Potassium: 5 mEq/L (ref 3.7–5.3)
Sodium: 144 mEq/L (ref 137–147)
Total Bilirubin: 0.4 mg/dL (ref 0.3–1.2)
Total Protein: 7.5 g/dL (ref 6.0–8.3)

## 2013-06-20 LAB — WET PREP, GENITAL
Clue Cells Wet Prep HPF POC: NONE SEEN
Trich, Wet Prep: NONE SEEN
Yeast Wet Prep HPF POC: NONE SEEN

## 2013-06-20 MED ORDER — CYCLOBENZAPRINE HCL 10 MG PO TABS: 5.0000 mg | ORAL_TABLET | Freq: Three times a day (TID) | ORAL | Status: AC | PRN

## 2013-06-20 NOTE — MAU Note (Signed)
Pt is having pain that started Monday night, on her lower right pelvic area.

## 2013-06-20 NOTE — Discharge Instructions (Signed)
You may be experiencing spasms in your psoas muscle that connects your abdomen to your legs. Do stretches to that area twice a day. Increase your water consumption and get to talk around one per hour when sitting for long periods of time.  Muscle Cramps and Spasms Muscle cramps and spasms occur when a muscle or muscles tighten and you have no control over this tightening (involuntary muscle contraction). They are a common problem and can develop in any muscle. The most common place is in the calf muscles of the leg. Both muscle cramps and muscle spasms are involuntary muscle contractions, but they also have differences:   Muscle cramps are sporadic and painful. They may last a few seconds to a quarter of an hour. Muscle cramps are often more forceful and last longer than muscle spasms.  Muscle spasms may or may not be painful. They may also last just a few seconds or much longer. CAUSES  It is uncommon for cramps or spasms to be due to a serious underlying problem. In many cases, the cause of cramps or spasms is unknown. Some common causes are:   Overexertion.   Overuse from repetitive motions (doing the same thing over and over).   Remaining in a certain position for a long period of time.   Improper preparation, form, or technique while performing a sport or activity.   Dehydration.   Injury.   Side effects of some medicines.   Abnormally low levels of the salts and ions in your blood (electrolytes), especially potassium and calcium. This could happen if you are taking water pills (diuretics) or you are pregnant.  Some underlying medical problems can make it more likely to develop cramps or spasms. These include, but are not limited to:   Diabetes.   Parkinson disease.   Hormone disorders, such as thyroid problems.   Alcohol abuse.   Diseases specific to muscles, joints, and bones.   Blood vessel disease where not enough blood is getting to the muscles.  HOME  CARE INSTRUCTIONS   Stay well hydrated. Drink enough water and fluids to keep your urine clear or pale yellow.  It may be helpful to massage, stretch, and relax the affected muscle.  For tight or tense muscles, use a warm towel, heating pad, or hot shower water directed to the affected area.  If you are sore or have pain after a cramp or spasm, applying ice to the affected area may relieve discomfort.  Put ice in a plastic bag.  Place a towel between your skin and the bag.  Leave the ice on for 15-20 minutes, 03-04 times a day.  Medicines used to treat a known cause of cramps or spasms may help reduce their frequency or severity. Only take over-the-counter or prescription medicines as directed by your caregiver. SEEK MEDICAL CARE IF:  Your cramps or spasms get more severe, more frequent, or do not improve over time.  MAKE SURE YOU:   Understand these instructions.  Will watch your condition.  Will get help right away if you are not doing well or get worse. Document Released: 08/07/2001 Document Revised: 06/12/2012 Document Reviewed: 02/02/2012 [facility] Patient Information 2014 [place], [place].  Sexually Transmitted Disease A sexually transmitted disease (STD) is a disease or infection that may be passed (transmitted) from person to person, usually during sexual activity. This may happen by way of saliva, semen, blood, vaginal mucus, or urine. Common STDs include:   Gonorrhea.   Chlamydia.   Syphilis.   HIV  and AIDS.   Genital herpes.   Hepatitis B and C.   Trichomonas.   Human papillomavirus (HPV).   Pubic lice.   Scabies.  Mites.  Bacterial vaginosis. WHAT ARE CAUSES OF STDs? An STD may be caused by bacteria, a virus, or parasites. STDs are often transmitted during sexual activity if one person is infected. However, they may also be transmitted through nonsexual means. STDs may be transmitted after:   Sexual intercourse with an infected person.    Sharing sex toys with an infected person.   Sharing needles with an infected person or using unclean piercing or tattoo needles.  Having intimate contact with the genitals, mouth, or rectal areas of an infected person.   Exposure to infected fluids during birth. WHAT ARE THE SIGNS AND SYMPTOMS OF STDs? Different STDs have different symptoms. Some people may not have any symptoms. If symptoms are present, they may include:   Painful or bloody urination.   Pain in the pelvis, abdomen, vagina, anus, throat, or eyes.   Skin rash, itching, irritation, growths, sores (lesions), ulcerations, or warts in the genital or anal area.  Abnormal vaginal discharge with or without bad odor.   Penile discharge in men.   Fever.   Pain or bleeding during sexual intercourse.   Swollen glands in the groin area.   Yellow skin and eyes (jaundice). This is seen with hepatitis.   Swollen testicles.  Infertility.  Sores and blisters in the mouth. HOW ARE STDs DIAGNOSED? To make a diagnosis, your health care provider may:   Take a medical history.   Perform a physical exam.   Take a sample of any discharge for examination.  Swab the throat, cervix, opening to the penis, rectum, or vagina for testing.  Test a sample of your first morning urine.   Perform blood tests.   Perform a Pap smear, if this applies.   Perform a colposcopy.   Perform a laparoscopy.  HOW ARE STDs TREATED? Treatment depends on the STD. Some STDs may be treated but not cured.   Chlamydia, gonorrhea, trichomonas, and syphilis can be cured with antibiotics.   Genital herpes, hepatitis, and HIV can be treated, but not cured, with prescribed medicines. The medicines lessen symptoms.   Genital warts from HPV can be treated with medicine or by freezing, burning (electrocautery), or surgery. Warts may come back.   HPV cannot be cured with medicine or surgery. However, abnormal areas may be  removed from the cervix, vagina, or vulva.   If your diagnosis is confirmed, your recent sexual partners need treatment. This is true even if they are symptom-free or have a negative culture or evaluation. They should not have sex until their health care providers say it is OK. HOW CAN I REDUCE MY RISK OF GETTING AN STD?  Use latex condoms, dental dams, and water-soluble lubricants during sexual activity. Do not use petroleum jelly or oils.  Get vaccinated for HPV and hepatitis. If you have not received these vaccines in the past, talk to your health care provider about whether one or both might be right for you.   Avoid risky sex practices that can break the skin.  WHAT SHOULD I DO IF I THINK I HAVE AN STD?  See your health care provider.   Inform all sexual partners. They should be tested and treated for any STDs.  Do not have sex until your health care provider says it is OK. WHEN SHOULD I GET HELP? Seek immediate medical care  if:  You develop severe abdominal pain.  You are a man and notice swelling or pain in the testicles.  You are a woman and notice swelling or pain in your vagina. Document Released: 05/08/2002 Document Revised: 12/06/2012 Document Reviewed: 09/05/2012 [facility] Patient Information 2014 [place], [place].

## 2013-06-20 NOTE — MAU Provider Note (Signed)
Chief Complaint: Abdominal Pain   First Provider Initiated Contact with Patient 06/20/13 2037     SUBJECTIVE HPI: Krista Kidd is a 28 y.o. G0P0000 female who presents with sharp, intermittent RLQ pain x 2 days. Aggravated by mvmt. Has not tried anything for the pain. No strenuous activity. Has been workign long hours sitting. Patient's last menstrual period was 04/27/2013.  Request STD testing.   Past Medical History  Diagnosis Date  . Abnormal Pap smear 08/2009  . Chlamydia   . Gonorrhea   . HPV in female   . Headache, migraine   . GERD (gastroesophageal reflux disease)   . Chlamydia   . Allergy    OB History  Gravida Para Term Preterm AB SAB TAB Ectopic Multiple Living  0 0 0 0 0 0 0 0 0 0        Past Surgical History  Procedure Laterality Date  . Colposcopy     History   Social History  . Marital Status: Married    Spouse Name: N/A    Number of Children: 0  . Years of Education: college   Occupational History  . Not on file.   Social History Main Topics  . Smoking status: Never Smoker   . Smokeless tobacco: Never Used  . Alcohol Use: Yes     Comment: occasional  . Drug Use: No  . Sexual Activity: Yes    Birth Control/ Protection: None, Condom, Pill   Other Topics Concern  . Not on file   Social History Narrative   Marital status:  Single; dating casually. Patient lives at home with grandfather. College education.      Children none      Employment:  Unemployed.      Tobacco; none       Alcohol:  Twice weekly      Drugs: none   No current facility-administered medications on file prior to encounter.   Current Outpatient Prescriptions on File Prior to Encounter  Medication Sig Dispense Refill  . Norethindrone-Ethinyl Estradiol-Fe Biphas (LO LOESTRIN FE) 1 MG-10 MCG / 10 MCG tablet Take 1 tablet by mouth daily.  1 Package  3  . omeprazole (PRILOSEC) 20 MG capsule Take 1 capsule (20 mg total) by mouth daily.  30 capsule  0  . rizatriptan (MAXALT-MLT)  5 MG disintegrating tablet Take 1 tablet (5 mg total) by mouth as needed for migraine. May repeat in 2 hours if needed  15 tablet  12   Allergies  Allergen Reactions  . Penicillins Hives    Patient says that she is allergic to ALL "Cillins"    ROS: Pertinent items in HPI. Denies fever, chills, loss of appetite, N/V/D/C, vaginal bleeding, vaginal discharge, dyspareunia.   OBJECTIVE Blood pressure 122/68, pulse 80, temperature 98.8 F (37.1 C), temperature source Oral, resp. rate 18, height 5\' 1"  (1.549 m), weight 59.421 kg (131 lb), last menstrual period 04/27/2013. GENERAL: Well-developed, well-nourished female in no acute distress.  HEENT: Normocephalic HEART: normal rate RESP: normal effort ABDOMEN: Soft, non-tender. Neg rebound or mass. Right groin tenderness and ? Muscle spasm. Normal BS x 4. Neg CVAT.  EXTREMITIES: Nontender, no edema NEURO: Alert and oriented SPECULUM EXAM: NEFG, physiologic discharge, scant blood noted, cervix clean BIMANUAL: cervix closed; uterus normal size, no adnexal tenderness or masses. No CMT.  LAB RESULTS Results for orders placed during the hospital encounter of 06/20/13 (from the past 24 hour(s))  URINALYSIS, ROUTINE W REFLEX MICROSCOPIC     Status: Abnormal  Collection Time    06/20/13  6:50 PM      Result Value Ref Range   Color, Urine YELLOW  YELLOW   APPearance CLEAR  CLEAR   Specific Gravity, Urine >1.030 (*) 1.005 - 1.030   pH 6.0  5.0 - 8.0   Glucose, UA NEGATIVE  NEGATIVE mg/dL   Hgb urine dipstick TRACE (*) NEGATIVE   Bilirubin Urine NEGATIVE  NEGATIVE   Ketones, ur NEGATIVE  NEGATIVE mg/dL   Protein, ur NEGATIVE  NEGATIVE mg/dL   Urobilinogen, UA 0.2  0.0 - 1.0 mg/dL   Nitrite NEGATIVE  NEGATIVE   Leukocytes, UA NEGATIVE  NEGATIVE  URINE MICROSCOPIC-ADD ON     Status: None   Collection Time    06/20/13  6:50 PM      Result Value Ref Range   Squamous Epithelial / LPF RARE  RARE   WBC, UA 0-2  <3 WBC/hpf   RBC / HPF 0-2  <3  RBC/hpf   Bacteria, UA RARE  RARE  POCT PREGNANCY, URINE     Status: None   Collection Time    06/20/13  7:20 PM      Result Value Ref Range   Preg Test, Ur NEGATIVE  NEGATIVE  CBC     Status: None   Collection Time    06/20/13  8:25 PM      Result Value Ref Range   WBC 9.7  4.0 - 10.5 K/uL   RBC 4.06  3.87 - 5.11 MIL/uL   Hemoglobin 13.0  12.0 - 15.0 g/dL   HCT 16.1  09.6 - 04.5 %   MCV 95.8  78.0 - 100.0 fL   MCH 32.0  26.0 - 34.0 pg   MCHC 33.4  30.0 - 36.0 g/dL   RDW 40.9  81.1 - 91.4 %   Platelets 273  150 - 400 K/uL  COMPREHENSIVE METABOLIC PANEL     Status: None   Collection Time    06/20/13  8:25 PM      Result Value Ref Range   Sodium 144  137 - 147 mEq/L   Potassium 5.0  3.7 - 5.3 mEq/L   Chloride 106  96 - 112 mEq/L   CO2 27  19 - 32 mEq/L   Glucose, Bld 95  70 - 99 mg/dL   BUN 9  6 - 23 mg/dL   Creatinine, Ser 7.82  0.50 - 1.10 mg/dL   Calcium 9.2  8.4 - 95.6 mg/dL   Total Protein 7.5  6.0 - 8.3 g/dL   Albumin 3.7  3.5 - 5.2 g/dL   AST 19  0 - 37 U/L   ALT 18  0 - 35 U/L   Alkaline Phosphatase 55  39 - 117 U/L   Total Bilirubin 0.4  0.3 - 1.2 mg/dL   GFR calc non Af Amer >90  >90 mL/min   GFR calc Af Amer >90  >90 mL/min    IMAGING No results found.  MAU COURSE  ASSESSMENT 1. Muscle spasm   2. Possible exposure to STD     PLAN Discharge home in stable condition. Low suspicion for appendicitis due to location of pain and absence of fever, leukocytosis and GI complaints.  Comfort measures.      Follow-up Information   Follow up with [facility]. (As needed for further STD tesing or gynecologic care)    Specialty:  Obstetrics and Gynecology   Contact information:   [address] [place] [id number] [phone]      Follow up with Primary care provider. (As needed, If symptoms worsen)        Medication List         cyclobenzaprine 10 MG tablet  Commonly known as:  FLEXERIL  Take  0.5-1 tablets (5-10 mg total) by mouth 3 (three) times daily as needed for muscle spasms.     naproxen sodium 220 MG tablet  Commonly known as:  ANAPROX  Take 440 mg by mouth daily as needed (Used for headache.).     Norethindrone-Ethinyl Estradiol-Fe Biphas 1 MG-10 MCG / 10 MCG tablet  Commonly known as:  LO LOESTRIN FE  Take 1 tablet by mouth daily.     omeprazole 20 MG capsule  Commonly known as:  PRILOSEC  Take 1 capsule (20 mg total) by mouth daily.     rizatriptan 5 MG disintegrating tablet  Commonly known as:  MAXALT-MLT  Take 1 tablet (5 mg total) by mouth as needed for migraine. May repeat in 2 hours if needed         [place] L[place]anna [place], [place] 06/20/2013  9:48 PM

## 2013-06-20 NOTE — MAU Note (Addendum)
abd pain started Monday night in lower abd.  Now is to rt of umbilicus.  Could hardly get up or lay down. If gets up too fast, gets a sharp pain there. Not as bad as Mon night

## 2013-06-21 LAB — GC/CHLAMYDIA PROBE AMP
CT Probe RNA: NEGATIVE
GC PROBE AMP APTIMA: NEGATIVE

## 2013-06-21 LAB — HEPATITIS B SURFACE ANTIGEN: Hepatitis B Surface Ag: NEGATIVE

## 2013-06-21 LAB — HIV ANTIBODY (ROUTINE TESTING W REFLEX): HIV 1&2 Ab, 4th Generation: NONREACTIVE

## 2013-06-21 LAB — RPR

## 2013-06-25 ENCOUNTER — Telehealth: Payer: Self-pay

## 2013-06-25 NOTE — Telephone Encounter (Signed)
Message copied by Louanna RawAMPBELL, Kore Madlock M on Mon Jun 25, 2013  8:06 AM ------      Message from: HollisSMITH, [place]      Created: Sun Jun 24, 2013 10:02 PM       Please notify pt of neg STD testing. ------

## 2013-06-25 NOTE — Telephone Encounter (Signed)
Pt. Informed of negative results.

## 2013-07-01 NOTE — MAU Provider Note (Signed)
Attestation of Attending Supervision of Advanced Practitioner: Evaluation and management procedures were performed by the PA/NP/CNM/OB Fellow under my supervision/collaboration. Chart reviewed and agree with management and plan.  Tilda BurrowJohn V Shalaunda Weatherholtz 07/01/2013 12:32 AM

## 2013-09-26 ENCOUNTER — Ambulatory Visit: Payer: BC Managed Care – PPO | Admitting: Neurology

## 2014-01-24 IMAGING — CR DG CHEST 2V
2 series · 2 of 2 positions shown · non-contrast
Comparison: None.

CLINICAL DATA: Chest pain

CHEST - 2 VIEW

[w chest pa]
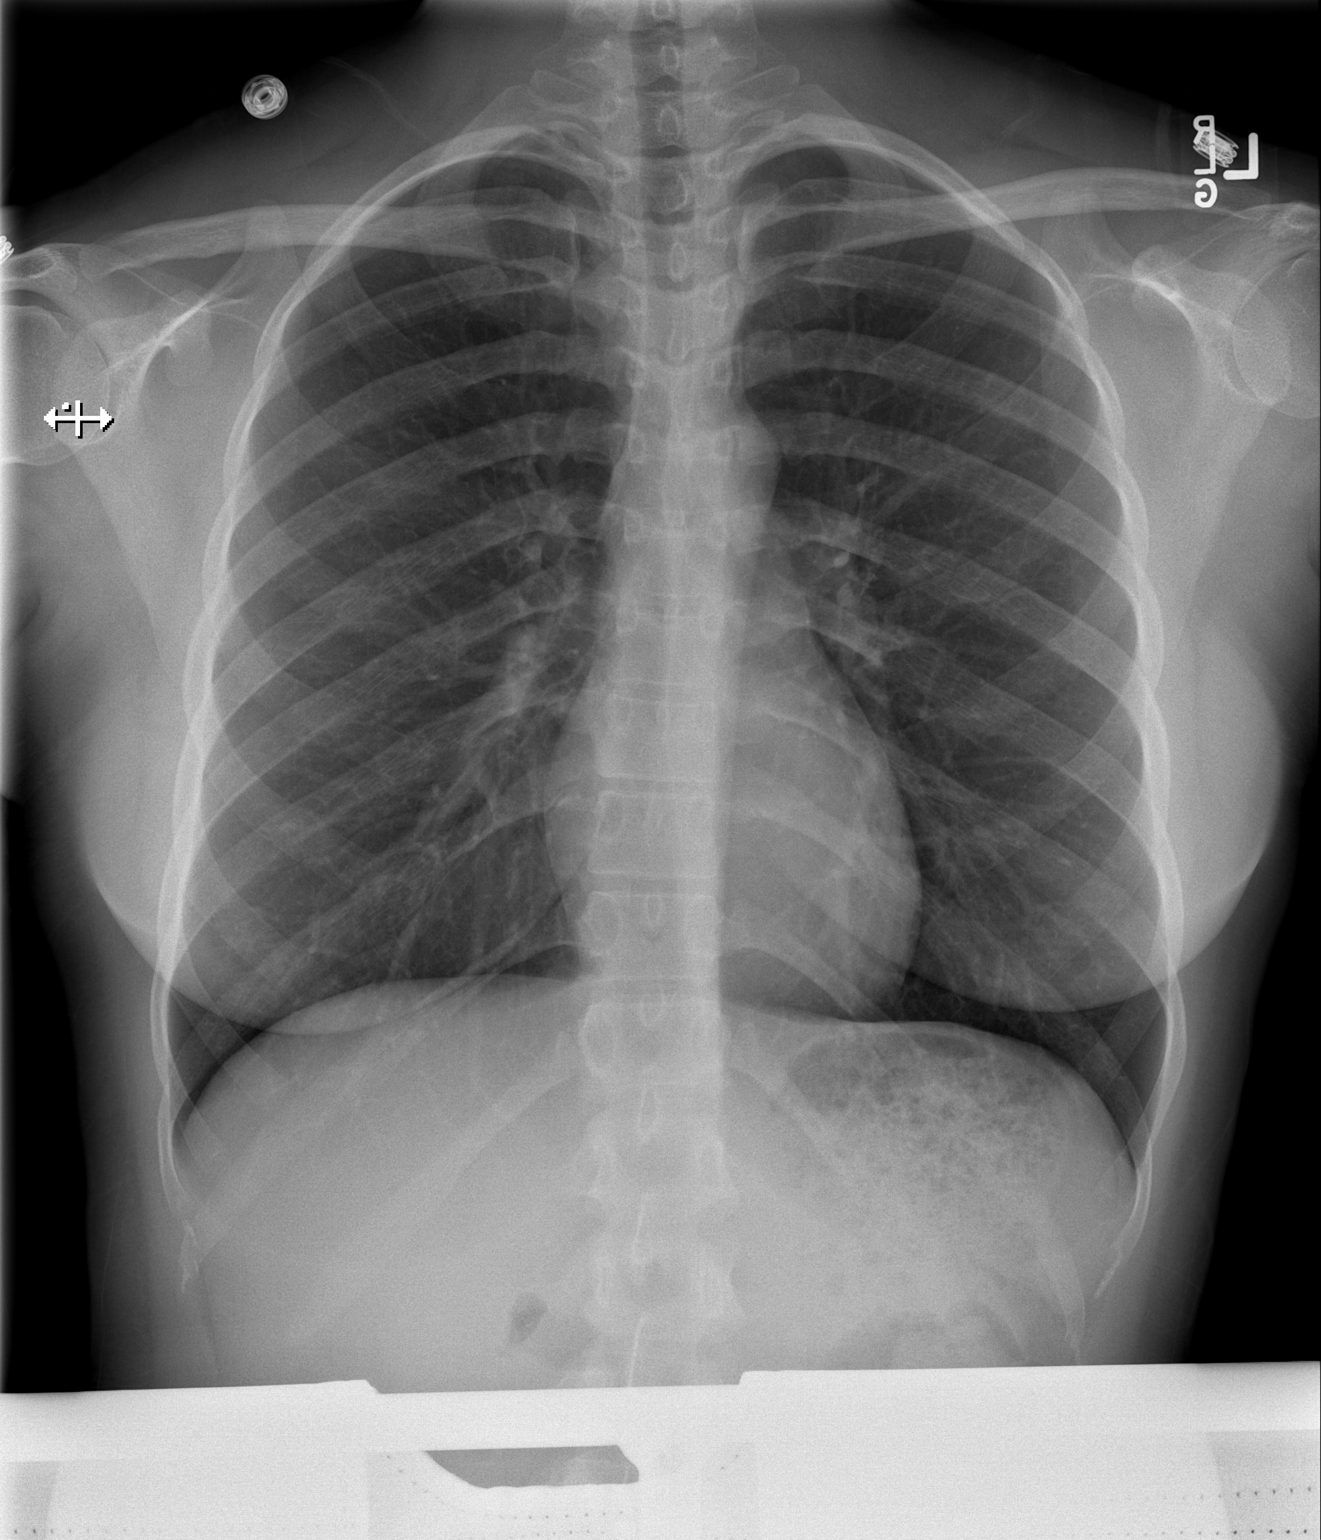

[w chest lat]
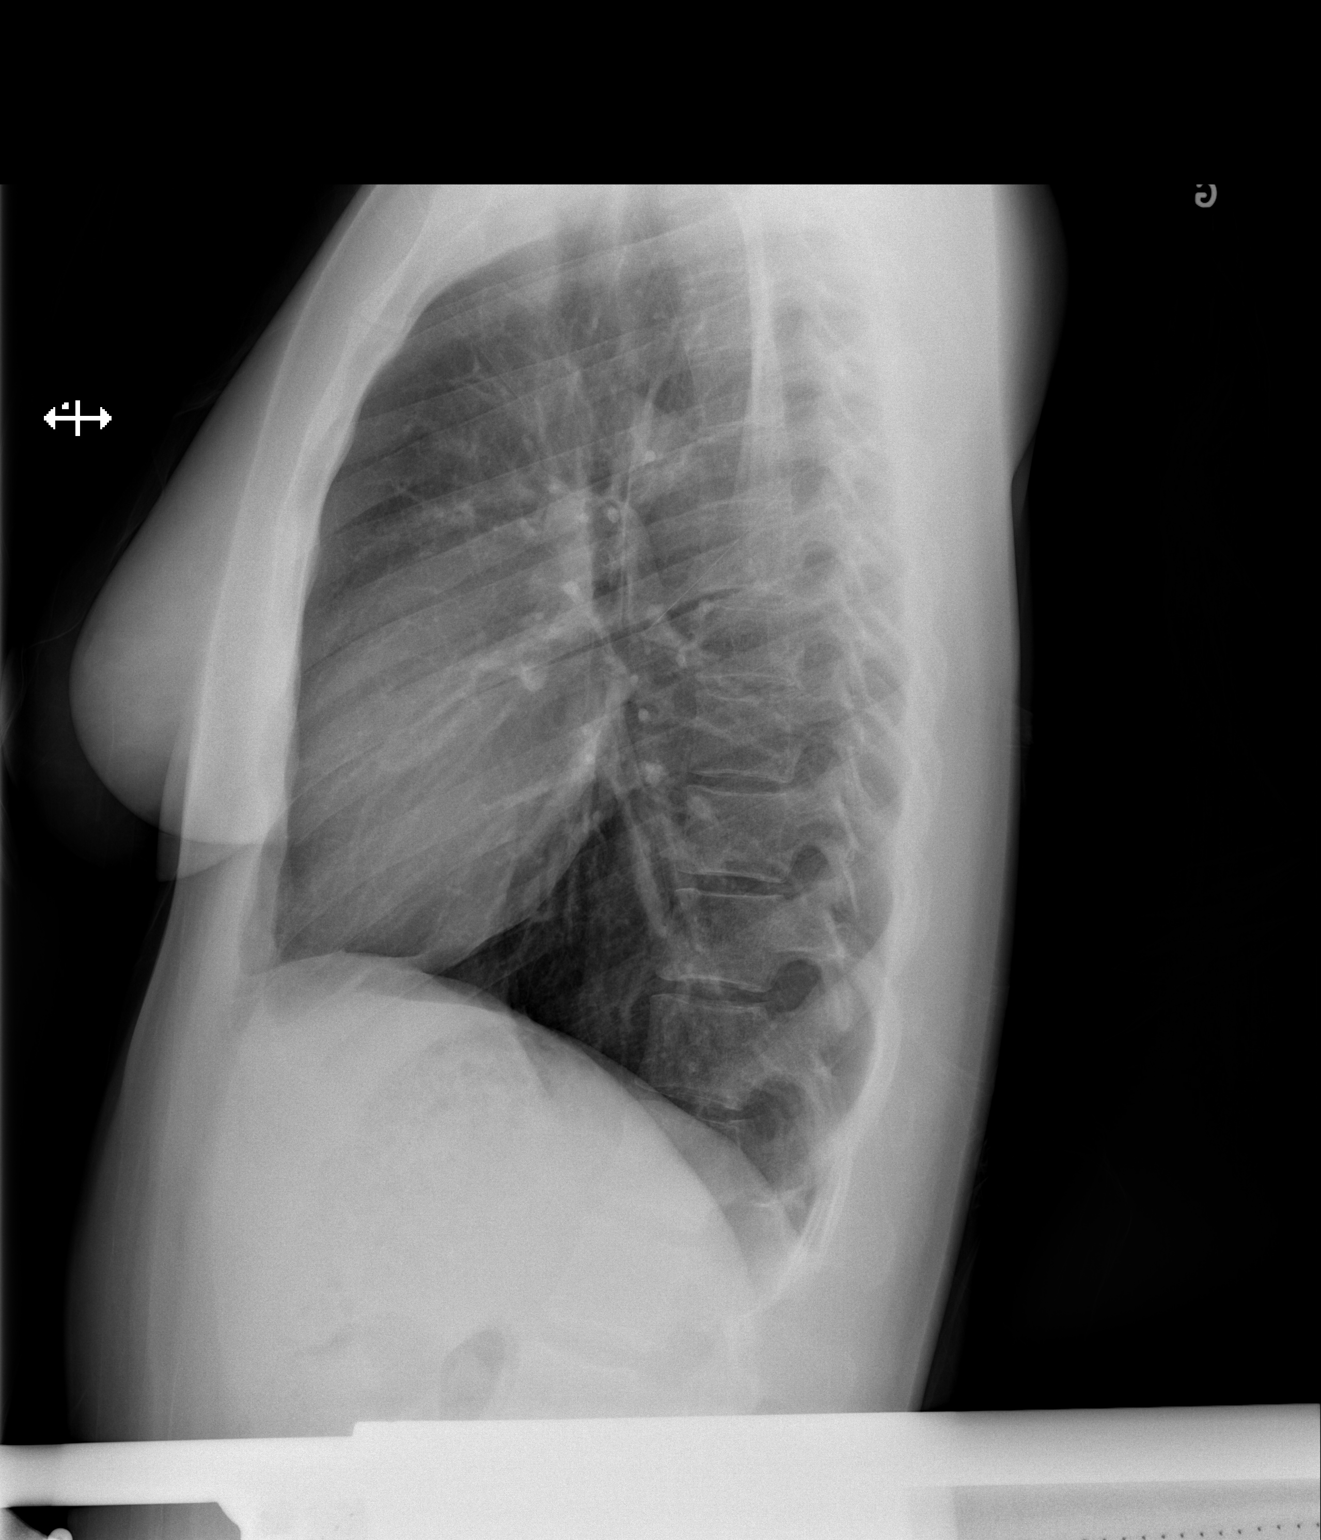

[2 of 2 positions shown; findings below may reference images not displayed]

FINDINGS: Lungs are clear. No pleural effusion or pneumothorax. The
cardiomediastinal contours are within normal limits. The visualized
bones and soft tissues are without significant appreciable
abnormality.
IMPRESSION: No radiographic evidence of acute cardiopulmonary process.

## 2015-07-13 IMAGING — CR DG CHEST 2V
2 series · 2 of 2 positions shown · non-contrast
Comparison: CT ABD/PELVIS W CM dated 12/01/2012; DG CHEST 2 VIEW
dated 11/14/2011

CLINICAL DATA: Chest pain radiating to the back.

EXAM:
CHEST  2 VIEW

[w chest pa]
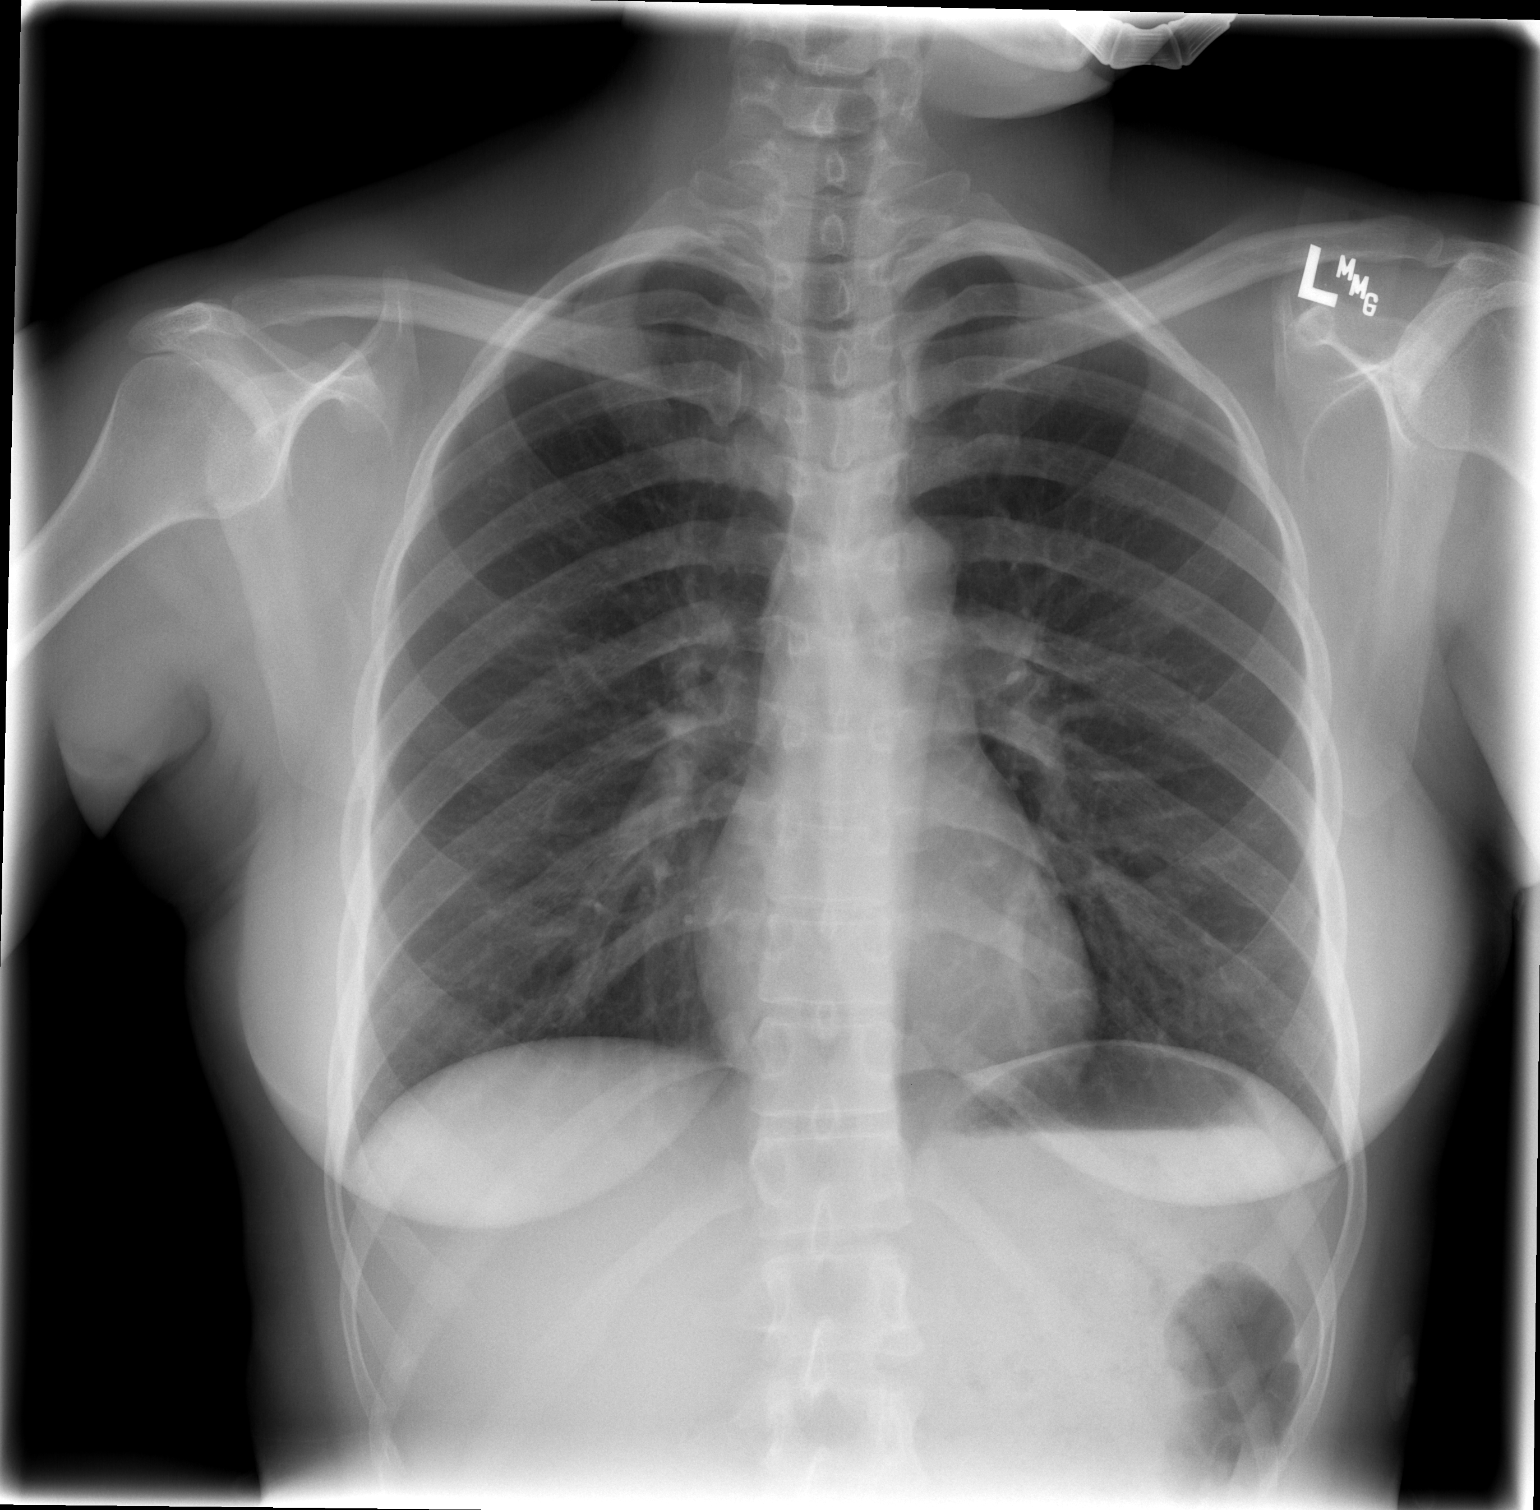

[w chest lat]
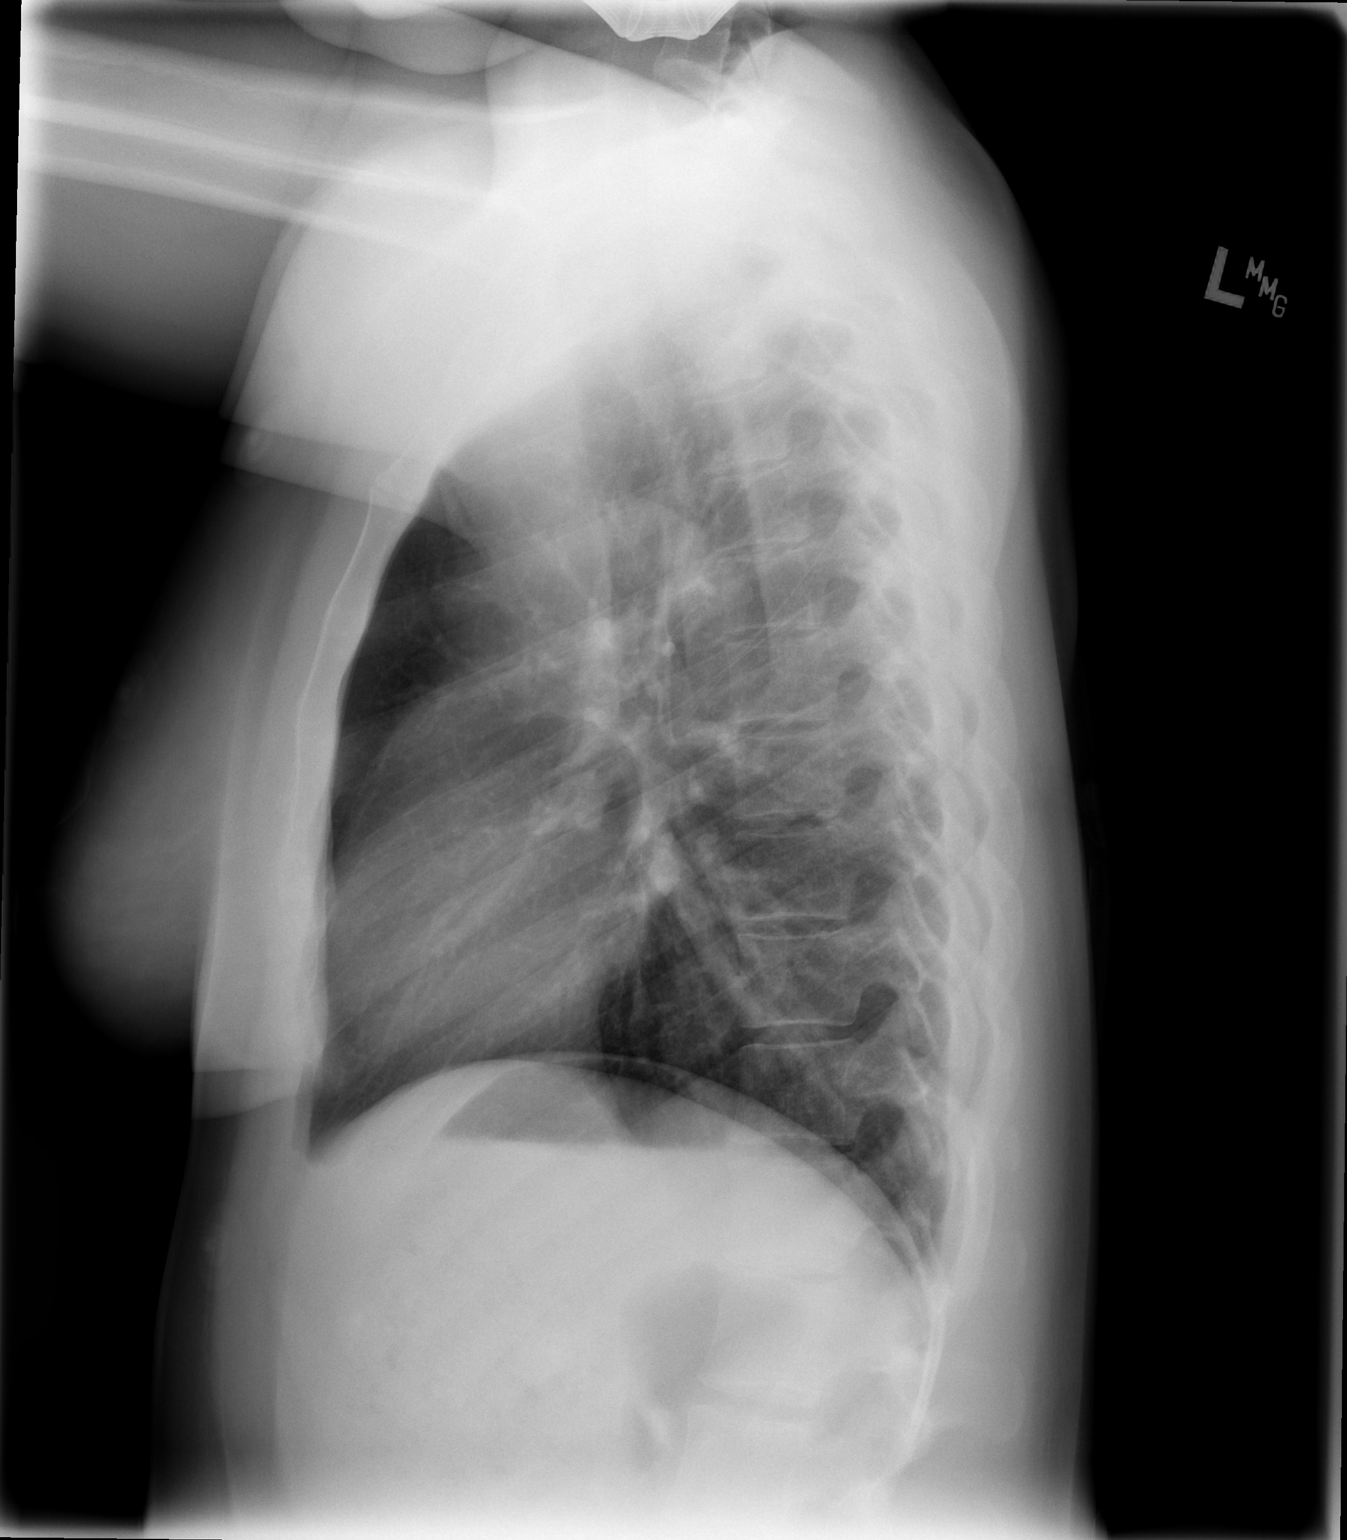

[2 of 2 positions shown; findings below may reference images not displayed]

FINDINGS: The heart size and mediastinal contours are within normal limits.
Both lungs are clear. The visualized skeletal structures are
unremarkable.
IMPRESSION: No active cardiopulmonary disease.
